# Patient Record
Sex: Female | Born: 1935 | Race: White | Hispanic: No | Marital: Single | Smoking: Never smoker
Health system: Southern US, Community
[De-identification: ages and names within clinical notes are randomized; demographics above are authoritative.]

## PROBLEM LIST (undated history)

## (undated) HISTORY — PX: TOTAL KNEE ARTHROPLASTY: SHX125

## (undated) HISTORY — PX: ABDOMINAL HYSTERECTOMY: SHX81

---

## 1997-06-09 ENCOUNTER — Encounter: Admission: RE | Admit: 1997-06-09 | Discharge: 1997-09-07 | Payer: Self-pay | Admitting: Orthopedic Surgery

## 1997-10-25 ENCOUNTER — Emergency Department (HOSPITAL_COMMUNITY): Admission: EM | Admit: 1997-10-25 | Discharge: 1997-10-25 | Payer: Self-pay | Admitting: Emergency Medicine

## 2002-03-19 ENCOUNTER — Encounter: Payer: Self-pay | Admitting: Orthopedic Surgery

## 2002-03-25 ENCOUNTER — Inpatient Hospital Stay (HOSPITAL_COMMUNITY): Admission: RE | Admit: 2002-03-25 | Discharge: 2002-03-30 | Payer: Self-pay | Admitting: Orthopedic Surgery

## 2008-05-06 ENCOUNTER — Encounter: Admission: RE | Admit: 2008-05-06 | Discharge: 2008-05-22 | Payer: Self-pay | Admitting: Orthopedic Surgery

## 2010-07-23 NOTE — Op Note (Signed)
[name], Krista Paul                          ACCOUNT NO.:  [id number]   MEDICAL RECORD NO.:  [id number]                   PATIENT TYPE:   LOCATION:                                       FACILITY:   PHYSICIAN:  Elana Alm. Thurston Hole, M.D.              DATE OF BIRTH:  11-15-1921   DATE OF PROCEDURE:  03/25/2002  DATE OF DISCHARGE:                                 OPERATIVE REPORT   PREOPERATIVE DIAGNOSIS:  Left knee degenerative joint disease.   POSTOPERATIVE DIAGNOSIS:  Left knee degenerative joint disease.   PROCEDURE:  1. Left total knee replacement using [employer] Scorpio total knee system     with #9 cemented femoral component, #9 cemented tibial component with a     15 mm polyethylene tibial spacer, and a #7 cemented resurfacing patella.  2. Left knee lateral retinacular release.   SURGEON:  Elana Alm. Thurston Hole, M.D.   ASSISTANT:  Julien Girt, P.A.   ANESTHESIA:  General.   OPERATIVE TIME:  One hour and 20 minutes.   COMPLICATIONS:  None.   DESCRIPTION OF PROCEDURE:  The patient was brought to the operating room on  March 25, 2002, and placed on the operating room table in the supine  position.  After an adequate level of general anesthesia was obtained, her  left knee was examined under anesthesia.  Range of motion was 0-130 degrees,  moderate valgus deformity.  The knee stable to ligamentous examination, with  normal patella tracking.  She had a Foley catheter placed under sterile  conditions, and received Ancef 1 g IV preoperatively for prophylaxis.  The  left leg was prepped using sterile DuraPrep, and draped using a sterile  technique.  The leg was exsanguinated and a thigh tourniquet elevated to 350  mmHg.  Initially through a 20 cm longitudinal incision based over the  patella, initial exposure was made.  The underlying subcutaneous tissues  were incised along with the skin incision.  A median arthrotomy was  performed revealing an excessive amount of  normal-appearing joint fluid.  The articular surfaces were inspected.  She had grade 4 changes throughout  the knee.  The medial lateral meniscal remnants were removed, as well as the  anterior cruciate ligament, and osteophytes removed off the femoral condyle  to the patella and the tibial plateau.  Intramedullary drill drilled up the  femoral canal for the placement of the distal femoral cutting jig, which was  placed in the appropriate amount of rotation, and the distal 12 mm cut was  made.  The distal femur was sized.  A #9 was found to be the appropriate  size, and a #9 cutting jig was placed, and these cuts were made.  The  proximal tibia was exposed.  The tibial spines were removed with an  oscillating saw.  An intramedullary drill drilled down the tibial canal for  placement of the proximal tibial cutting jig  which was placed in the  appropriate amount of rotation, and a 4 mm cut was made based off the  collateral or lower side.  After this was done, then the Scorpio PCL cutter  was placed back on the distal femur, and these cuts were made.  At this  point then the #9 femoral trial was placed and a #9 tibial base plate trial  was placed, and with the 15 mm polyethylene spacer, there was found to be  excellent restoration of satisfactory alignment, with only 5-7 degrees of  valgus, anatomic in position, and excellent stability through a full range  of motion.  The tibial base plate was then marked for rotation, and the keel  cut was made.  After this was done, then the patella was sized.  A #7  resurfacing was found to be the appropriate size, and a 10 mm resurfacing  cut was made, and three locking holes placed.  After this was done, it was  felt that all the trial components were of excellent size, fit and  stability.  They were removed.  The knee was then jet lavaged and irrigated  with 3 L of saline solution.  The proximal tibia was then exposed, and the  #9 tibial base plate  with cement backing was hammered into position, with an  excellent fit, with excess cement being removed from around the edges.  A #9  femoral component with cement backing was then hammered into position, also  with an excellent fit, with excess cement being removed from around the  edges.  The 15 mm polyethylene spacer was placed.  Range of motion was  tested 0-120 degrees, with excellent stability.  The #7 resurfacing patella  was placed and locked into position, with excess cement being removed from  the edges.  After the cement hardened, the patella and femoral tracking was  evaluated.  There was still some moderate lateral tracking due to her valgus  deformity, and a lateral retinacular release was carried out, thus improving  the patellar tracking to normal.  After this was done, it was felt that all  the components were of excellent size, fit and stability.  The knee was  further irrigated with antibiotic solution, and then the arthrotomy was  closed with #1 Ethibond suture over two medium Hemovac drains.  The  subcutaneous tissues were closed with #0 and #2-0 Vicryl.  The skin was  closed with skin staples.  Sterile dressings were applied.  The Hemovac was  injected with 0.25% Marcaine with epinephrine and clamped.  The tourniquet  was released after sterile dressings were placed.  The patient had a femoral  nerve block placed following the procedure, for postoperative pain control.  She was then awakened and taken to the recovery room in stable condition.  The needle and sponge counts were correct x2 at the end of the case.                                                Robert A. Thurston Hole, M.D.    RAW/MEDQ  D:  03/25/2002  T:  03/25/2002  Job:  [id number]

## 2010-07-23 NOTE — Discharge Summary (Signed)
NAME:  Krista Paul, Krista Paul                        ACCOUNT NO.:  [id number]   MEDICAL RECORD NO.:  [id number]                   PATIENT TYPE:  INP   LOCATION:  5025                                 FACILITY:  [facility]   PHYSICIAN:  Elana Alm. Thurston Hole, M.D.              DATE OF BIRTH:  Sep 01, 1921   DATE OF ADMISSION:  03/25/2002  DATE OF DISCHARGE:  03/30/2002                                 DISCHARGE SUMMARY   ADMISSION DIAGNOSES:  1. End-stage degenerative joint disease, right knee.  2. Hypertension.  3. High cholesterol.   DISCHARGE DIAGNOSES:  1. End-stage degenerative joint disease.  2. Hypertension.  3. High cholesterol.  4. Hypokalemia.   HISTORY OF PRESENT ILLNESS:  The patient is a 77 year old white female with  a history of end-stage DJD of her right knee.  She has failed conservative  treatment including anti-inflammatories and cortisone injections.  At this  point in time, she had pain at night and pain with rest unrelieved by any  medications.  She understands the risks, benefits, and possible  complications of a total knee replacement and is without question.   PROCEDURE IN HOUSE:  On 03/25/02, she underwent a right total knee  replacement by Dr. Thurston Hole.  She tolerated the procedure well.  She was  admitted postoperatively for DVT prophylaxis, pain control, and physical  therapy.   HOSPITAL COURSE:  On postoperative day #1, hemoglobin was 9.1.  The INR was  1.6.  Her potassium was 6.  Her potassium was discontinued out of her IV  fluids.  She was changed to normal saline.  Her sodium was 132.  On  postoperative day #2, sodium continued to drop and now was 129 despite being  on normal saline fluids.  Medicine was consulted for this.  Her hemoglobin  was 7.2.  She was transfused two units of packed red blood cells.  On  postoperative day #3, the patient is doing significantly better.  Her pain  is under control with Darvocet-N 100.  Her hemoglobin is 10.5 post-  transfusion.  Her sodium is now 142.  Her INR is 1.5.  On postoperative day  #4, the patient is doing well.  Her pain is controlled by Darvocet.  Her T-  max is 100.2 degrees Fahrenheit.  Temperature evaluation was 97.3 degrees  Fahrenheit.  Her blood pressure was under control.  Her potassium was 3.8.  Her sodium was 139.  Her INR was 1.5.  Her hemoglobin was 10.  She was  discharged to home in stable condition.   DISCHARGE MEDICATIONS:  1. Darvocet-N 100 1-2 q.4-6 h. p.r.n. pain.  2. Coumadin 1 mg three tablets daily.  3. Colace 100 mg 2 p.o. daily.  4. Senokot two tablets p.o. q.h.s.  5. Iron one tablet twice a day.    FOLLOW UP:  She will follow up in our office in 10 days for suture removal  and x-rays.  She is weightbearing as tolerated on a regular diet.  She has  been instructed to call with increased temperature, increased redness,  increased drainage.     Kirstin Shepperson, P.A.                  Robert A. Thurston Hole, M.D.    KS/MEDQ  D:  04/14/2002  T:  04/14/2002  Job:  [id number]

## 2011-09-30 DIAGNOSIS — L570 Actinic keratosis: Secondary | ICD-10-CM | POA: Diagnosis not present

## 2011-12-09 DIAGNOSIS — Z23 Encounter for immunization: Secondary | ICD-10-CM | POA: Diagnosis not present

## 2012-04-09 DIAGNOSIS — Z13820 Encounter for screening for osteoporosis: Secondary | ICD-10-CM | POA: Diagnosis not present

## 2012-04-09 DIAGNOSIS — C439 Malignant melanoma of skin, unspecified: Secondary | ICD-10-CM | POA: Diagnosis not present

## 2012-04-09 DIAGNOSIS — E7800 Pure hypercholesterolemia, unspecified: Secondary | ICD-10-CM | POA: Diagnosis not present

## 2012-04-09 DIAGNOSIS — Z23 Encounter for immunization: Secondary | ICD-10-CM | POA: Diagnosis not present

## 2012-04-09 DIAGNOSIS — I10 Essential (primary) hypertension: Secondary | ICD-10-CM | POA: Diagnosis not present

## 2012-04-09 DIAGNOSIS — Z0000 Encounter for general adult medical examination without abnormal findings: Secondary | ICD-10-CM | POA: Diagnosis not present

## 2012-04-23 DIAGNOSIS — D649 Anemia, unspecified: Secondary | ICD-10-CM | POA: Diagnosis not present

## 2012-04-25 DIAGNOSIS — Z1231 Encounter for screening mammogram for malignant neoplasm of breast: Secondary | ICD-10-CM | POA: Diagnosis not present

## 2012-04-25 DIAGNOSIS — M810 Age-related osteoporosis without current pathological fracture: Secondary | ICD-10-CM | POA: Diagnosis not present

## 2012-04-27 DIAGNOSIS — D649 Anemia, unspecified: Secondary | ICD-10-CM | POA: Diagnosis not present

## 2012-05-24 DIAGNOSIS — M810 Age-related osteoporosis without current pathological fracture: Secondary | ICD-10-CM | POA: Diagnosis not present

## 2012-10-24 DIAGNOSIS — R609 Edema, unspecified: Secondary | ICD-10-CM | POA: Diagnosis not present

## 2012-10-24 DIAGNOSIS — L089 Local infection of the skin and subcutaneous tissue, unspecified: Secondary | ICD-10-CM | POA: Diagnosis not present

## 2012-11-29 DIAGNOSIS — Z23 Encounter for immunization: Secondary | ICD-10-CM | POA: Diagnosis not present

## 2012-12-18 DIAGNOSIS — L821 Other seborrheic keratosis: Secondary | ICD-10-CM | POA: Diagnosis not present

## 2012-12-18 DIAGNOSIS — D239 Other benign neoplasm of skin, unspecified: Secondary | ICD-10-CM | POA: Diagnosis not present

## 2013-04-11 DIAGNOSIS — M810 Age-related osteoporosis without current pathological fracture: Secondary | ICD-10-CM | POA: Diagnosis not present

## 2013-04-11 DIAGNOSIS — Z0000 Encounter for general adult medical examination without abnormal findings: Secondary | ICD-10-CM | POA: Diagnosis not present

## 2013-04-11 DIAGNOSIS — R82998 Other abnormal findings in urine: Secondary | ICD-10-CM | POA: Diagnosis not present

## 2013-04-11 DIAGNOSIS — E7800 Pure hypercholesterolemia, unspecified: Secondary | ICD-10-CM | POA: Diagnosis not present

## 2013-04-11 DIAGNOSIS — C439 Malignant melanoma of skin, unspecified: Secondary | ICD-10-CM | POA: Diagnosis not present

## 2013-04-11 DIAGNOSIS — I10 Essential (primary) hypertension: Secondary | ICD-10-CM | POA: Diagnosis not present

## 2013-04-11 DIAGNOSIS — E559 Vitamin D deficiency, unspecified: Secondary | ICD-10-CM | POA: Diagnosis not present

## 2013-04-11 DIAGNOSIS — Z23 Encounter for immunization: Secondary | ICD-10-CM | POA: Diagnosis not present

## 2013-07-03 DIAGNOSIS — M79609 Pain in unspecified limb: Secondary | ICD-10-CM | POA: Diagnosis not present

## 2013-07-03 DIAGNOSIS — M2040 Other hammer toe(s) (acquired), unspecified foot: Secondary | ICD-10-CM | POA: Diagnosis not present

## 2013-07-03 DIAGNOSIS — D2370 Other benign neoplasm of skin of unspecified lower limb, including hip: Secondary | ICD-10-CM | POA: Diagnosis not present

## 2013-11-26 DIAGNOSIS — Z23 Encounter for immunization: Secondary | ICD-10-CM | POA: Diagnosis not present

## 2013-12-31 DIAGNOSIS — H25013 Cortical age-related cataract, bilateral: Secondary | ICD-10-CM | POA: Diagnosis not present

## 2014-01-21 DIAGNOSIS — D485 Neoplasm of uncertain behavior of skin: Secondary | ICD-10-CM | POA: Diagnosis not present

## 2014-01-21 DIAGNOSIS — L570 Actinic keratosis: Secondary | ICD-10-CM | POA: Diagnosis not present

## 2014-03-11 DIAGNOSIS — M9903 Segmental and somatic dysfunction of lumbar region: Secondary | ICD-10-CM | POA: Diagnosis not present

## 2014-03-11 DIAGNOSIS — M9902 Segmental and somatic dysfunction of thoracic region: Secondary | ICD-10-CM | POA: Diagnosis not present

## 2014-03-11 DIAGNOSIS — M5125 Other intervertebral disc displacement, thoracolumbar region: Secondary | ICD-10-CM | POA: Diagnosis not present

## 2014-03-11 DIAGNOSIS — M9901 Segmental and somatic dysfunction of cervical region: Secondary | ICD-10-CM | POA: Diagnosis not present

## 2014-03-11 DIAGNOSIS — M5020 Other cervical disc displacement, unspecified cervical region: Secondary | ICD-10-CM | POA: Diagnosis not present

## 2014-04-03 DIAGNOSIS — Z1231 Encounter for screening mammogram for malignant neoplasm of breast: Secondary | ICD-10-CM | POA: Diagnosis not present

## 2014-04-09 DIAGNOSIS — R5381 Other malaise: Secondary | ICD-10-CM | POA: Diagnosis not present

## 2014-04-09 DIAGNOSIS — M79602 Pain in left arm: Secondary | ICD-10-CM | POA: Diagnosis not present

## 2014-04-17 DIAGNOSIS — L603 Nail dystrophy: Secondary | ICD-10-CM | POA: Diagnosis not present

## 2014-04-17 DIAGNOSIS — L84 Corns and callosities: Secondary | ICD-10-CM | POA: Diagnosis not present

## 2014-04-17 DIAGNOSIS — I739 Peripheral vascular disease, unspecified: Secondary | ICD-10-CM | POA: Diagnosis not present

## 2014-05-01 DIAGNOSIS — Z0000 Encounter for general adult medical examination without abnormal findings: Secondary | ICD-10-CM | POA: Diagnosis not present

## 2014-05-01 DIAGNOSIS — N189 Chronic kidney disease, unspecified: Secondary | ICD-10-CM | POA: Diagnosis not present

## 2014-05-01 DIAGNOSIS — R899 Unspecified abnormal finding in specimens from other organs, systems and tissues: Secondary | ICD-10-CM | POA: Diagnosis not present

## 2014-05-01 DIAGNOSIS — M810 Age-related osteoporosis without current pathological fracture: Secondary | ICD-10-CM | POA: Diagnosis not present

## 2014-05-01 DIAGNOSIS — R8290 Unspecified abnormal findings in urine: Secondary | ICD-10-CM | POA: Diagnosis not present

## 2014-05-01 DIAGNOSIS — I10 Essential (primary) hypertension: Secondary | ICD-10-CM | POA: Diagnosis not present

## 2014-05-01 DIAGNOSIS — C439 Malignant melanoma of skin, unspecified: Secondary | ICD-10-CM | POA: Diagnosis not present

## 2014-05-01 DIAGNOSIS — E780 Pure hypercholesterolemia: Secondary | ICD-10-CM | POA: Diagnosis not present

## 2014-09-15 ENCOUNTER — Encounter (HOSPITAL_COMMUNITY): Payer: Self-pay

## 2014-09-15 ENCOUNTER — Emergency Department (HOSPITAL_COMMUNITY): Payer: Medicare Other

## 2014-09-15 ENCOUNTER — Emergency Department (HOSPITAL_COMMUNITY)
Admission: EM | Admit: 2014-09-15 | Discharge: 2014-09-15 | Disposition: A | Discharge status: Home / Self Care | Payer: Medicare Other | Attending: Emergency Medicine | Admitting: Emergency Medicine

## 2014-09-15 DIAGNOSIS — Y9389 Activity, other specified: Secondary | ICD-10-CM | POA: Insufficient documentation

## 2014-09-15 DIAGNOSIS — S0003XA Contusion of scalp, initial encounter: Secondary | ICD-10-CM | POA: Diagnosis not present

## 2014-09-15 DIAGNOSIS — W01198A Fall on same level from slipping, tripping and stumbling with subsequent striking against other object, initial encounter: Secondary | ICD-10-CM | POA: Insufficient documentation

## 2014-09-15 DIAGNOSIS — S0990XA Unspecified injury of head, initial encounter: Secondary | ICD-10-CM | POA: Diagnosis not present

## 2014-09-15 DIAGNOSIS — Z043 Encounter for examination and observation following other accident: Secondary | ICD-10-CM | POA: Diagnosis present

## 2014-09-15 DIAGNOSIS — Y998 Other external cause status: Secondary | ICD-10-CM | POA: Diagnosis not present

## 2014-09-15 DIAGNOSIS — Y9289 Other specified places as the place of occurrence of the external cause: Secondary | ICD-10-CM | POA: Insufficient documentation

## 2014-09-15 DIAGNOSIS — W19XXXA Unspecified fall, initial encounter: Secondary | ICD-10-CM

## 2014-09-15 LAB — CBC WITH DIFFERENTIAL/PLATELET
Basophils Absolute: 0 10*3/uL (ref 0.0–0.1)
Basophils Relative: 1 % (ref 0–1)
Eosinophils Absolute: 0.1 10*3/uL (ref 0.0–0.7)
Eosinophils Relative: 3 % (ref 0–5)
HCT: 36.1 % (ref 36.0–46.0)
Hemoglobin: 11.8 g/dL — ABNORMAL LOW (ref 12.0–15.0)
Lymphocytes Relative: 14 % (ref 12–46)
Lymphs Abs: 0.6 10*3/uL — ABNORMAL LOW (ref 0.7–4.0)
MCH: 28.9 pg (ref 26.0–34.0)
MCHC: 32.7 g/dL (ref 30.0–36.0)
MCV: 88.3 fL (ref 78.0–100.0)
Monocytes Absolute: 0.4 10*3/uL (ref 0.1–1.0)
Monocytes Relative: 8 % (ref 3–12)
Neutro Abs: 3.3 10*3/uL (ref 1.7–7.7)
Neutrophils Relative %: 74 % (ref 43–77)
Platelets: 213 10*3/uL (ref 150–400)
RBC: 4.09 MIL/uL (ref 3.87–5.11)
RDW: 13.5 % (ref 11.5–15.5)
WBC: 4.5 10*3/uL (ref 4.0–10.5)

## 2014-09-15 LAB — BASIC METABOLIC PANEL
Anion gap: 11 (ref 5–15)
BUN: 28 mg/dL — ABNORMAL HIGH (ref 6–20)
CO2: 23 mmol/L (ref 22–32)
Calcium: 10.3 mg/dL (ref 8.9–10.3)
Chloride: 106 mmol/L (ref 101–111)
Creatinine, Ser: 1.32 mg/dL — ABNORMAL HIGH (ref 0.44–1.00)
GFR calc Af Amer: 40 mL/min — ABNORMAL LOW (ref >60)
GFR calc non Af Amer: 34 mL/min — ABNORMAL LOW (ref >60)
Glucose, Bld: 98 mg/dL (ref 65–99)
Potassium: 4.8 mmol/L (ref 3.5–5.1)
Sodium: 140 mmol/L (ref 135–145)

## 2014-09-15 NOTE — Discharge Instructions (Signed)
°  Facial or Scalp Contusion A facial or scalp contusion is a deep bruise on the face or head. Injuries to the face and head generally cause a lot of swelling, especially around the eyes. Contusions are the result of an injury that caused bleeding under the skin. The contusion may turn blue, purple, or yellow. Minor injuries will give you a painless contusion, but more severe contusions may stay painful and swollen for a few weeks.  CAUSES  A facial or scalp contusion is caused by a blunt injury or trauma to the face or head area.  SIGNS AND SYMPTOMS   Swelling of the injured area.   Discoloration of the injured area.   Tenderness, soreness, or pain in the injured area.  DIAGNOSIS  The diagnosis can be made by taking a medical history and doing a physical exam. An X-ray exam, CT scan, or MRI may be needed to determine if there are any associated injuries, such as broken bones (fractures). TREATMENT  Often, the best treatment for a facial or scalp contusion is applying cold compresses to the injured area. Over-the-counter medicines may also be recommended for pain control.  HOME CARE INSTRUCTIONS   Only take over-the-counter or prescription medicines as directed by your health care provider.   Apply ice to the injured area.   Put ice in a plastic bag.   Place a towel between your skin and the bag.   Leave the ice on for 20 minutes, 2-3 times a day.  SEEK MEDICAL CARE IF:  You have bite problems.   You have pain with chewing.   You are concerned about facial defects. SEEK IMMEDIATE MEDICAL CARE IF:  You have severe pain or a headache that is not relieved by medicine.   You have unusual sleepiness, confusion, or personality changes.   You throw up (vomit).   You have a persistent nosebleed.   You have double vision or blurred vision.   You have fluid drainage from your nose or ear.   You have difficulty walking or using your arms or legs.  MAKE SURE YOU:     Understand these instructions.  Will watch your condition.  Will get help right away if you are not doing well or get worse. Document Released: 03/31/2004 Document Revised: 12/12/2012 Document Reviewed: 10/04/2012 [facility] Patient Information 2015 [place], [place]. This information is not intended to replace advice given to you by your health care provider. Make sure you discuss any questions you have with your health care provider.

## 2014-09-15 NOTE — ED Provider Notes (Signed)
CSN: [id number]     Arrival date & time 09/15/14  0808 History   First MD Initiated Contact with Patient 09/15/14 [phone]     Chief Complaint  Patient presents with  . Fall     (Consider location/radiation/quality/duration/timing/severity/associated sxs/prior Treatment) HPI   89yF who woke up this morning feeling "blah." "I never feel this way." Only thing patient can attribute this two is fall she had two days ago. "My feet got tangled up." Did hit head. No LOC. Denies any pain where. No n/v. No respiratory complaints. No fever or chills. No urinary complaints.  No blood thinners. Baseline mental status per daughter.   History reviewed. No pertinent past medical history. History reviewed. No pertinent past surgical history. No family history on file. History  Substance Use Topics  . Smoking status: Never Smoker   . Smokeless tobacco: Not on file  . Alcohol Use: No   OB History    No data available     Review of Systems  All systems reviewed and negative, other than as noted in HPI.   Allergies  Review of patient's allergies indicates no known allergies.  Home Medications   Prior to Admission medications   Not on File   BP 149/81 mmHg  Pulse 80  Temp(Src) 98.1 F (36.7 C) (Oral)  Resp 16  SpO2 99% Physical Exam  Constitutional: She is oriented to person, place, and time. She appears well-developed and well-nourished. No distress.  HENT:  Head: Normocephalic and atraumatic.  Faint ecchymosis and mild swelling r forehead and peri orbitally.   Eyes: Conjunctivae are normal. Right eye exhibits no discharge. Left eye exhibits no discharge.  Neck: Neck supple.  Cardiovascular: Normal rate, regular rhythm and normal heart sounds.  Exam reveals no gallop and no friction rub.   No murmur heard. Pulmonary/Chest: Effort normal and breath sounds normal. No respiratory distress.  Abdominal: Soft. She exhibits no distension. There is no tenderness.  Musculoskeletal: She  exhibits no edema or tenderness.  No midline spinal tenderness. No bony tenderness of extremities or apparent pain with ROM of large joints.   Neurological: She is alert and oriented to person, place, and time. No cranial nerve deficit. She exhibits normal muscle tone. Coordination normal.  Steady gait  Skin: Skin is warm and dry.  Psychiatric: She has a normal mood and affect. Her behavior is normal. Thought content normal.  Nursing note and vitals reviewed.   ED Course  Procedures (including critical care time) Labs Review Labs Reviewed  CBC WITH DIFFERENTIAL/PLATELET - Abnormal; Notable for the following:    Hemoglobin 11.8 (*)    Lymphs Abs 0.6 (*)    All other components within normal limits  BASIC METABOLIC PANEL - Abnormal; Notable for the following:    BUN 28 (*)    Creatinine, Ser 1.32 (*)    GFR calc non Af Amer 34 (*)    GFR calc Af Amer 40 (*)    All other components within normal limits    Imaging Review Ct Head Wo Contrast  09/15/2014   CLINICAL DATA:  [place] 2 days ago.  Bruising around eyes.  EXAM: CT HEAD WITHOUT CONTRAST  TECHNIQUE: Contiguous axial images were obtained from the base of the skull through the vertex without contrast.  COMPARISON:  None  FINDINGS: There is age-appropriate atrophy. Low-density throughout the periventricular white matter likely represents chronic changes. No evidence for acute hemorrhage, mass lesion, midline shift, hydrocephalus or large infarct.  There is a right frontal  scalp hematoma. Globes are grossly normal. Probable calcifications in the left basal ganglia region. Paranasal sinuses are clear. Calvarium is intact. No acute bone abnormality.  IMPRESSION: No acute intracranial abnormality.  Right scalp hematoma without underlying fracture.  White matter changes suggest chronic small vessel ischemic disease.   Electronically Signed   By: Markus Daft M.D.   On: 09/15/2014 09:54     EKG Interpretation   Date/Time:  Monday September 15 2014  10:02:17 EDT Ventricular Rate:  75 PR Interval:  195 QRS Duration: 133 QT Interval:  427 QTC Calculation: 477 R Axis:   -75 Text Interpretation:  Sinus rhythm RBBB and LAFB Left ventricular  hypertrophy Confirmed by Wilson Singer  MD, Bela Nyborg ([id number]) on 09/15/2014 10:13:35  AM      MDM   Final diagnoses:  Fall, initial encounter  Scalp contusion, initial encounter    89yF who woke up this morning feeling "blah" but currently has no complaints. Fall two days ago. CT head as above. No specific urinary complaints. Nonfocal neuro exam. Labs fairly unremarkable. Denies pain.     Virgel Manifold, MD 09/15/14 [phone]

## 2014-09-15 NOTE — ED Notes (Signed)
Patient transported to CT 

## 2014-09-15 NOTE — ED Notes (Signed)
Pt presents with c/o fall that occurred on Saturday. Pt reports she fell onto a wooden fence and then the ground. Pt reports she did hit her head but denies any LOC. Pt has some purple bruising around her eyes. Pt denies any pain.

## 2014-11-04 DIAGNOSIS — M9901 Segmental and somatic dysfunction of cervical region: Secondary | ICD-10-CM | POA: Diagnosis not present

## 2014-11-04 DIAGNOSIS — M9903 Segmental and somatic dysfunction of lumbar region: Secondary | ICD-10-CM | POA: Diagnosis not present

## 2014-11-04 DIAGNOSIS — M5020 Other cervical disc displacement, unspecified cervical region: Secondary | ICD-10-CM | POA: Diagnosis not present

## 2014-11-04 DIAGNOSIS — M5125 Other intervertebral disc displacement, thoracolumbar region: Secondary | ICD-10-CM | POA: Diagnosis not present

## 2014-11-04 DIAGNOSIS — M9902 Segmental and somatic dysfunction of thoracic region: Secondary | ICD-10-CM | POA: Diagnosis not present

## 2014-11-18 DIAGNOSIS — M9903 Segmental and somatic dysfunction of lumbar region: Secondary | ICD-10-CM | POA: Diagnosis not present

## 2014-11-18 DIAGNOSIS — M5125 Other intervertebral disc displacement, thoracolumbar region: Secondary | ICD-10-CM | POA: Diagnosis not present

## 2014-11-18 DIAGNOSIS — M5020 Other cervical disc displacement, unspecified cervical region: Secondary | ICD-10-CM | POA: Diagnosis not present

## 2014-11-18 DIAGNOSIS — M9901 Segmental and somatic dysfunction of cervical region: Secondary | ICD-10-CM | POA: Diagnosis not present

## 2014-11-18 DIAGNOSIS — M9902 Segmental and somatic dysfunction of thoracic region: Secondary | ICD-10-CM | POA: Diagnosis not present

## 2014-12-29 DIAGNOSIS — Z23 Encounter for immunization: Secondary | ICD-10-CM | POA: Diagnosis not present

## 2015-01-08 DIAGNOSIS — H2513 Age-related nuclear cataract, bilateral: Secondary | ICD-10-CM | POA: Diagnosis not present

## 2015-01-13 DIAGNOSIS — M5020 Other cervical disc displacement, unspecified cervical region: Secondary | ICD-10-CM | POA: Diagnosis not present

## 2015-01-13 DIAGNOSIS — M9903 Segmental and somatic dysfunction of lumbar region: Secondary | ICD-10-CM | POA: Diagnosis not present

## 2015-01-13 DIAGNOSIS — M5125 Other intervertebral disc displacement, thoracolumbar region: Secondary | ICD-10-CM | POA: Diagnosis not present

## 2015-01-13 DIAGNOSIS — M9902 Segmental and somatic dysfunction of thoracic region: Secondary | ICD-10-CM | POA: Diagnosis not present

## 2015-01-13 DIAGNOSIS — M9901 Segmental and somatic dysfunction of cervical region: Secondary | ICD-10-CM | POA: Diagnosis not present

## 2015-03-24 DIAGNOSIS — M9903 Segmental and somatic dysfunction of lumbar region: Secondary | ICD-10-CM | POA: Diagnosis not present

## 2015-03-24 DIAGNOSIS — M5125 Other intervertebral disc displacement, thoracolumbar region: Secondary | ICD-10-CM | POA: Diagnosis not present

## 2015-03-24 DIAGNOSIS — M5020 Other cervical disc displacement, unspecified cervical region: Secondary | ICD-10-CM | POA: Diagnosis not present

## 2015-03-24 DIAGNOSIS — M9902 Segmental and somatic dysfunction of thoracic region: Secondary | ICD-10-CM | POA: Diagnosis not present

## 2015-03-24 DIAGNOSIS — M9901 Segmental and somatic dysfunction of cervical region: Secondary | ICD-10-CM | POA: Diagnosis not present

## 2015-03-27 DIAGNOSIS — L603 Nail dystrophy: Secondary | ICD-10-CM | POA: Diagnosis not present

## 2015-03-27 DIAGNOSIS — I739 Peripheral vascular disease, unspecified: Secondary | ICD-10-CM | POA: Diagnosis not present

## 2015-04-10 DIAGNOSIS — D485 Neoplasm of uncertain behavior of skin: Secondary | ICD-10-CM | POA: Diagnosis not present

## 2015-04-10 DIAGNOSIS — L814 Other melanin hyperpigmentation: Secondary | ICD-10-CM | POA: Diagnosis not present

## 2015-04-20 DIAGNOSIS — D2372 Other benign neoplasm of skin of left lower limb, including hip: Secondary | ICD-10-CM | POA: Diagnosis not present

## 2015-04-21 DIAGNOSIS — M5020 Other cervical disc displacement, unspecified cervical region: Secondary | ICD-10-CM | POA: Diagnosis not present

## 2015-04-21 DIAGNOSIS — M9901 Segmental and somatic dysfunction of cervical region: Secondary | ICD-10-CM | POA: Diagnosis not present

## 2015-04-21 DIAGNOSIS — M9903 Segmental and somatic dysfunction of lumbar region: Secondary | ICD-10-CM | POA: Diagnosis not present

## 2015-04-21 DIAGNOSIS — M5125 Other intervertebral disc displacement, thoracolumbar region: Secondary | ICD-10-CM | POA: Diagnosis not present

## 2015-04-21 DIAGNOSIS — M9902 Segmental and somatic dysfunction of thoracic region: Secondary | ICD-10-CM | POA: Diagnosis not present

## 2015-05-19 DIAGNOSIS — M5125 Other intervertebral disc displacement, thoracolumbar region: Secondary | ICD-10-CM | POA: Diagnosis not present

## 2015-05-19 DIAGNOSIS — M5020 Other cervical disc displacement, unspecified cervical region: Secondary | ICD-10-CM | POA: Diagnosis not present

## 2015-05-19 DIAGNOSIS — M9903 Segmental and somatic dysfunction of lumbar region: Secondary | ICD-10-CM | POA: Diagnosis not present

## 2015-05-19 DIAGNOSIS — M9902 Segmental and somatic dysfunction of thoracic region: Secondary | ICD-10-CM | POA: Diagnosis not present

## 2015-05-19 DIAGNOSIS — M9901 Segmental and somatic dysfunction of cervical region: Secondary | ICD-10-CM | POA: Diagnosis not present

## 2015-05-21 DIAGNOSIS — R8290 Unspecified abnormal findings in urine: Secondary | ICD-10-CM | POA: Diagnosis not present

## 2015-05-21 DIAGNOSIS — M810 Age-related osteoporosis without current pathological fracture: Secondary | ICD-10-CM | POA: Diagnosis not present

## 2015-05-21 DIAGNOSIS — Z85820 Personal history of malignant melanoma of skin: Secondary | ICD-10-CM | POA: Diagnosis not present

## 2015-05-21 DIAGNOSIS — M899 Disorder of bone, unspecified: Secondary | ICD-10-CM | POA: Diagnosis not present

## 2015-05-21 DIAGNOSIS — I10 Essential (primary) hypertension: Secondary | ICD-10-CM | POA: Diagnosis not present

## 2015-05-21 DIAGNOSIS — Z0000 Encounter for general adult medical examination without abnormal findings: Secondary | ICD-10-CM | POA: Diagnosis not present

## 2015-05-21 DIAGNOSIS — N189 Chronic kidney disease, unspecified: Secondary | ICD-10-CM | POA: Diagnosis not present

## 2015-05-21 DIAGNOSIS — E7800 Pure hypercholesterolemia, unspecified: Secondary | ICD-10-CM | POA: Diagnosis not present

## 2015-06-02 DIAGNOSIS — M5020 Other cervical disc displacement, unspecified cervical region: Secondary | ICD-10-CM | POA: Diagnosis not present

## 2015-06-02 DIAGNOSIS — M9902 Segmental and somatic dysfunction of thoracic region: Secondary | ICD-10-CM | POA: Diagnosis not present

## 2015-06-02 DIAGNOSIS — M9903 Segmental and somatic dysfunction of lumbar region: Secondary | ICD-10-CM | POA: Diagnosis not present

## 2015-06-02 DIAGNOSIS — M9901 Segmental and somatic dysfunction of cervical region: Secondary | ICD-10-CM | POA: Diagnosis not present

## 2015-06-02 DIAGNOSIS — M5125 Other intervertebral disc displacement, thoracolumbar region: Secondary | ICD-10-CM | POA: Diagnosis not present

## 2015-06-25 DIAGNOSIS — I739 Peripheral vascular disease, unspecified: Secondary | ICD-10-CM | POA: Diagnosis not present

## 2015-06-25 DIAGNOSIS — L603 Nail dystrophy: Secondary | ICD-10-CM | POA: Diagnosis not present

## 2015-07-30 ENCOUNTER — Encounter (HOSPITAL_COMMUNITY): Payer: Self-pay

## 2015-07-30 ENCOUNTER — Emergency Department (HOSPITAL_COMMUNITY)
Admission: EM | Admit: 2015-07-30 | Discharge: 2015-07-30 | Disposition: A | Discharge status: Home / Self Care | Payer: Medicare Other | Attending: Emergency Medicine | Admitting: Emergency Medicine

## 2015-07-30 ENCOUNTER — Emergency Department (HOSPITAL_COMMUNITY): Payer: Medicare Other

## 2015-07-30 DIAGNOSIS — R51 Headache: Secondary | ICD-10-CM | POA: Insufficient documentation

## 2015-07-30 DIAGNOSIS — Z96652 Presence of left artificial knee joint: Secondary | ICD-10-CM | POA: Insufficient documentation

## 2015-07-30 DIAGNOSIS — Z7982 Long term (current) use of aspirin: Secondary | ICD-10-CM | POA: Diagnosis not present

## 2015-07-30 DIAGNOSIS — R519 Headache, unspecified: Secondary | ICD-10-CM

## 2015-07-30 DIAGNOSIS — R413 Other amnesia: Secondary | ICD-10-CM

## 2015-07-30 LAB — CBC WITH DIFFERENTIAL/PLATELET
Basophils Absolute: 0 10*3/uL (ref 0.0–0.1)
Basophils Relative: 1 %
EOS ABS: 0.1 10*3/uL (ref 0.0–0.7)
Eosinophils Relative: 1 %
HEMATOCRIT: 35.9 % — AB (ref 36.0–46.0)
HEMOGLOBIN: 11.8 g/dL — AB (ref 12.0–15.0)
Lymphocytes Relative: 17 %
Lymphs Abs: 0.9 10*3/uL (ref 0.7–4.0)
MCH: 28.2 pg (ref 26.0–34.0)
MCHC: 32.9 g/dL (ref 30.0–36.0)
MCV: 85.9 fL (ref 78.0–100.0)
MONOS PCT: 5 %
Monocytes Absolute: 0.3 10*3/uL (ref 0.1–1.0)
NEUTROS ABS: 3.9 10*3/uL (ref 1.7–7.7)
Neutrophils Relative %: 76 %
Platelets: 225 10*3/uL (ref 150–400)
RBC: 4.18 MIL/uL (ref 3.87–5.11)
RDW: 13.4 % (ref 11.5–15.5)
WBC: 5.1 10*3/uL (ref 4.0–10.5)

## 2015-07-30 LAB — URINALYSIS, ROUTINE W REFLEX MICROSCOPIC
Bilirubin Urine: NEGATIVE
GLUCOSE, UA: NEGATIVE mg/dL
Ketones, ur: NEGATIVE mg/dL
Leukocytes, UA: NEGATIVE
Nitrite: NEGATIVE
PH: 7 (ref 5.0–8.0)
Protein, ur: NEGATIVE mg/dL
SPECIFIC GRAVITY, URINE: 1.012 (ref 1.005–1.030)

## 2015-07-30 LAB — URINE MICROSCOPIC-ADD ON

## 2015-07-30 LAB — COMPREHENSIVE METABOLIC PANEL
ALT: 16 U/L (ref 14–54)
AST: 22 U/L (ref 15–41)
Albumin: 4.2 g/dL (ref 3.5–5.0)
Alkaline Phosphatase: 100 U/L (ref 38–126)
Anion gap: 9 (ref 5–15)
BUN: 27 mg/dL — ABNORMAL HIGH (ref 6–20)
CHLORIDE: 108 mmol/L (ref 101–111)
CO2: 23 mmol/L (ref 22–32)
CREATININE: 1.11 mg/dL — AB (ref 0.44–1.00)
Calcium: 10.2 mg/dL (ref 8.9–10.3)
GFR calc Af Amer: 49 mL/min — ABNORMAL LOW (ref >60)
GFR calc non Af Amer: 42 mL/min — ABNORMAL LOW (ref >60)
Glucose, Bld: 92 mg/dL (ref 65–99)
Potassium: 4.5 mmol/L (ref 3.5–5.1)
SODIUM: 140 mmol/L (ref 135–145)
Total Bilirubin: 0.7 mg/dL (ref 0.3–1.2)
Total Protein: 7.3 g/dL (ref 6.5–8.1)

## 2015-07-30 MED ORDER — DOXYCYCLINE HYCLATE 100 MG PO CAPS: 100.0000 mg | ORAL_CAPSULE | Freq: Two times a day (BID) | ORAL | Status: DC

## 2015-07-30 NOTE — ED Provider Notes (Signed)
CSN: [id number]     Arrival date & time 07/30/15  1500 History   First MD Initiated Contact with Patient 07/30/15 1535     Chief Complaint  Patient presents with  . Headache   PT WAS SENT INTO THE ED TODAY B/C HER PCP WANTS HER TO GET AN MRI.  THE PT WOKE UP THIS AM WITH A HEADACHE AND IT'S BEEN GOING ON ALL DAY.  THE PT SAID THAT SHE WAS HAVING MEMORY PROBLEMS THIS MORNING THAT LASTED ABOUT 2 HRS.  THE MEMORY PROBLEMS ARE GONE AND SHE HAS NO OTHER NEURO DEFICITS.  THE PT'S PCP TRIED TO GET AN OUTPATIENT MRI, BUT WAS UNABLE TO GET AN APPT FOR PT.  PT'S PCP ALSO WANTS TO GET A RMSF TITER AND START PT ON DOXY IF EVERYTHING NL.  (Consider location/radiation/quality/duration/timing/severity/associated sxs/prior Treatment) Patient is a 82 y.o. female presenting with headaches. The history is provided by the patient.  Headache Pain location:  Generalized Quality:  Dull Radiates to:  Does not radiate Severity currently:  4/10 Onset quality:  Gradual Timing:  Constant Progression:  Unchanged Chronicity:  New Similar to prior headaches: no     History reviewed. No pertinent past medical history. Past Surgical History  Procedure Laterality Date  . Abdominal hysterectomy    . Total knee arthroplasty Left    History reviewed. No pertinent family history. Social History  Substance Use Topics  . Smoking status: Never Smoker   . Smokeless tobacco: None  . Alcohol Use: No   OB History    No data available     Review of Systems  Neurological: Positive for headaches.  All other systems reviewed and are negative.     Allergies  Codeine  Home Medications   Prior to Admission medications   Medication Sig Start Date End Date Taking? Authorizing Provider  amLODipine (NORVASC) 5 MG tablet Take 5 mg by mouth daily. 08/12/14  Yes Historical Provider, MD  aspirin EC 81 MG tablet Take 81 mg by mouth daily.   Yes Historical Provider, MD  ibuprofen (ADVIL,MOTRIN) 200 MG tablet Take 200 mg by  mouth daily as needed for moderate pain.   Yes Historical Provider, MD  OVER THE COUNTER MEDICATION Place 1 patch onto the skin daily as needed (pain). Over the counter lidocaine patch   Yes Historical Provider, MD  pravastatin (PRAVACHOL) 10 MG tablet Take 10 mg by mouth daily. 08/20/14  Yes Historical Provider, MD  doxycycline (VIBRAMYCIN) 100 MG capsule Take 1 capsule (100 mg total) by mouth 2 (two) times daily. 07/30/15   Isla Pence, MD   BP 169/105 mmHg  Pulse 94  Temp(Src) 98.3 F (36.8 C) (Oral)  Resp 18  SpO2 95% Physical Exam  Constitutional: She is oriented to person, place, and time. She appears well-developed and well-nourished.  HENT:  Head: Normocephalic and atraumatic.  Right Ear: External ear normal.  Left Ear: External ear normal.  Nose: Nose normal.  Mouth/Throat: Oropharynx is clear and moist.  Eyes: Conjunctivae and EOM are normal. Pupils are equal, round, and reactive to light.  Neck: Normal range of motion. Neck supple.  Cardiovascular: Normal rate, regular rhythm, normal heart sounds and intact distal pulses.   Pulmonary/Chest: Effort normal and breath sounds normal.  Abdominal: Soft. Bowel sounds are normal.  Musculoskeletal: Normal range of motion.  Neurological: She is alert and oriented to person, place, and time.  Skin: Skin is warm and dry.  Psychiatric: She has a normal mood and affect. Her behavior  is normal. Judgment and thought content normal.  Nursing note and vitals reviewed.   ED Course  Procedures (including critical care time) Labs Review Labs Reviewed  URINALYSIS, ROUTINE W REFLEX MICROSCOPIC (NOT AT [facility]) - Abnormal; Notable for the following:    Hgb urine dipstick SMALL (*)    All other components within normal limits  URINE MICROSCOPIC-ADD ON - Abnormal; Notable for the following:    Squamous Epithelial / LPF 0-5 (*)    Bacteria, UA RARE (*)    All other components within normal limits  COMPREHENSIVE METABOLIC PANEL  CBC WITH  DIFFERENTIAL/PLATELET  ROCKY MTN SPOTTED FVR ABS PNL(IGG+IGM)    Imaging Review No results found. I have personally reviewed and evaluated these images and lab results as part of my medical decision-making.   EKG Interpretation None      MDM  PT SIGNED OUT TO DR. BELFI WHO WILL FOLLOW RESULTS OF MRI AND LABS.  Final diagnoses:  Acute nonintractable headache, unspecified headache type        Isla Pence, MD 07/30/15 [phone]

## 2015-07-30 NOTE — ED Notes (Signed)
Patient transported to MRI 

## 2015-07-30 NOTE — ED Notes (Signed)
Patient made aware of urine sample. States she cannot void at this time. Encouraged to void when able.

## 2015-07-30 NOTE — ED Notes (Signed)
Bed: WA06 Expected date:  Expected time:  Means of arrival:  Comments: 

## 2015-07-30 NOTE — Discharge Instructions (Signed)

## 2015-07-30 NOTE — ED Notes (Signed)
Pt's daughter reports "the doctor also wants her checked for [facility] Spotted fever."  Pt denies bites or red spots.

## 2015-07-30 NOTE — ED Provider Notes (Signed)
Results for orders placed or performed during the hospital encounter of 07/30/15  Comprehensive metabolic panel  Result Value Ref Range   Sodium 140 135 - 145 mmol/L   Potassium 4.5 3.5 - 5.1 mmol/L   Chloride 108 101 - 111 mmol/L   CO2 23 22 - 32 mmol/L   Glucose, Bld 92 65 - 99 mg/dL   BUN 27 (H) 6 - 20 mg/dL   Creatinine, Ser 1.11 (H) 0.44 - 1.00 mg/dL   Calcium 10.2 8.9 - 10.3 mg/dL   Total Protein 7.3 6.5 - 8.1 g/dL   Albumin 4.2 3.5 - 5.0 g/dL   AST 22 15 - 41 U/L   ALT 16 14 - 54 U/L   Alkaline Phosphatase 100 38 - 126 U/L   Total Bilirubin 0.7 0.3 - 1.2 mg/dL   GFR calc non Af Amer 42 (L) >60 mL/min   GFR calc Af Amer 49 (L) >60 mL/min   Anion gap 9 5 - 15  CBC with Differential  Result Value Ref Range   WBC 5.1 4.0 - 10.5 K/uL   RBC 4.18 3.87 - 5.11 MIL/uL   Hemoglobin 11.8 (L) 12.0 - 15.0 g/dL   HCT 35.9 (L) 36.0 - 46.0 %   MCV 85.9 78.0 - 100.0 fL   MCH 28.2 26.0 - 34.0 pg   MCHC 32.9 30.0 - 36.0 g/dL   RDW 13.4 11.5 - 15.5 %   Platelets 225 150 - 400 K/uL   Neutrophils Relative % 76 %   Neutro Abs 3.9 1.7 - 7.7 K/uL   Lymphocytes Relative 17 %   Lymphs Abs 0.9 0.7 - 4.0 K/uL   Monocytes Relative 5 %   Monocytes Absolute 0.3 0.1 - 1.0 K/uL   Eosinophils Relative 1 %   Eosinophils Absolute 0.1 0.0 - 0.7 K/uL   Basophils Relative 1 %   Basophils Absolute 0.0 0.0 - 0.1 K/uL  Urinalysis, Routine w reflex microscopic  Result Value Ref Range   Color, Urine YELLOW YELLOW   APPearance CLEAR CLEAR   Specific Gravity, Urine 1.012 1.005 - 1.030   pH 7.0 5.0 - 8.0   Glucose, UA NEGATIVE NEGATIVE mg/dL   Hgb urine dipstick SMALL (A) NEGATIVE   Bilirubin Urine NEGATIVE NEGATIVE   Ketones, ur NEGATIVE NEGATIVE mg/dL   Protein, ur NEGATIVE NEGATIVE mg/dL   Nitrite NEGATIVE NEGATIVE   Leukocytes, UA NEGATIVE NEGATIVE  Urine microscopic-add on  Result Value Ref Range   Squamous Epithelial / LPF 0-5 (A) NONE SEEN   WBC, UA 0-5 0 - 5 WBC/hpf   RBC / HPF 0-5 0 - 5  RBC/hpf   Bacteria, UA RARE (A) NONE SEEN   Urine-Other MUCOUS PRESENT    Mr Brain Wo Contrast  07/30/2015  CLINICAL DATA:  Memory loss.  Headache. EXAM: MRI HEAD WITHOUT CONTRAST TECHNIQUE: Multiplanar, multiecho pulse sequences of the brain and surrounding structures were obtained without intravenous contrast. COMPARISON:  Head CT 09/15/2014 FINDINGS: There is no evidence of acute infarct, intracranial hemorrhage, intra-axial mass, midline shift, or extra-axial fluid collection. Age related cerebral atrophy is noted. Periventricular white-matter T2 hyperintensities are nonspecific but compatible with mild chronic small vessel ischemic disease. Patchy T2 hyperintensity is noted in the deep gray nuclei bilaterally as well. 1 cm exostosis or calcified meningioma in the right parietal region is unchanged and likely of no clinical significance. Orbits are unremarkable. Right maxillary sinus mucous retention cyst is noted. Mastoid air cells are clear. Major intracranial vascular flow voids are  preserved. IMPRESSION: 1. No acute intracranial abnormality. 2. Mild chronic small vessel ischemic disease. Electronically Signed   By: Logan Bores M.D.   On: 07/30/2015 19:15    Pt feeling much better.  MRI neg.  Labs unremarkable.  Will d/c to f/u with PCP.  Started on doxycycline as per plan.  Malvin Johns, MD 07/30/15 2023

## 2015-07-30 NOTE — ED Notes (Signed)
Pt c/o headache and memory problems since waking up this morning.  Pain score  4/10.  Pt has not taken anything for pain.  Denies blurred vision and light sensitivity.  Denies numbness, weakness, and tingling.  Denies n/v/d.  Pt reports that memory issues have resolved.  Pt was seen by PCP earlier and referred to [facility] for a MRI.  However, imaging center had not appointments and directed Pt to the ED.

## 2015-07-30 NOTE — ED Notes (Signed)
Pt ambulated to restroom. 

## 2015-07-30 NOTE — ED Notes (Signed)
Lab delay -  Pt in MRI

## 2015-07-31 LAB — ROCKY MTN SPOTTED FVR ABS PNL(IGG+IGM)
RMSF IGG: NEGATIVE
RMSF IgM: 0.23 index (ref 0.00–0.89)

## 2015-08-12 DIAGNOSIS — H2513 Age-related nuclear cataract, bilateral: Secondary | ICD-10-CM | POA: Diagnosis not present

## 2015-08-17 DIAGNOSIS — L570 Actinic keratosis: Secondary | ICD-10-CM | POA: Diagnosis not present

## 2015-08-17 DIAGNOSIS — L723 Sebaceous cyst: Secondary | ICD-10-CM | POA: Diagnosis not present

## 2015-08-17 DIAGNOSIS — D239 Other benign neoplasm of skin, unspecified: Secondary | ICD-10-CM | POA: Diagnosis not present

## 2015-08-17 DIAGNOSIS — L821 Other seborrheic keratosis: Secondary | ICD-10-CM | POA: Diagnosis not present

## 2015-09-04 DIAGNOSIS — I739 Peripheral vascular disease, unspecified: Secondary | ICD-10-CM | POA: Diagnosis not present

## 2015-09-04 DIAGNOSIS — L84 Corns and callosities: Secondary | ICD-10-CM | POA: Diagnosis not present

## 2015-09-04 DIAGNOSIS — L603 Nail dystrophy: Secondary | ICD-10-CM | POA: Diagnosis not present

## 2015-09-15 DIAGNOSIS — M5020 Other cervical disc displacement, unspecified cervical region: Secondary | ICD-10-CM | POA: Diagnosis not present

## 2015-09-15 DIAGNOSIS — M9903 Segmental and somatic dysfunction of lumbar region: Secondary | ICD-10-CM | POA: Diagnosis not present

## 2015-09-15 DIAGNOSIS — M5125 Other intervertebral disc displacement, thoracolumbar region: Secondary | ICD-10-CM | POA: Diagnosis not present

## 2015-09-15 DIAGNOSIS — M9901 Segmental and somatic dysfunction of cervical region: Secondary | ICD-10-CM | POA: Diagnosis not present

## 2015-09-15 DIAGNOSIS — M9902 Segmental and somatic dysfunction of thoracic region: Secondary | ICD-10-CM | POA: Diagnosis not present

## 2015-09-29 DIAGNOSIS — M9901 Segmental and somatic dysfunction of cervical region: Secondary | ICD-10-CM | POA: Diagnosis not present

## 2015-09-29 DIAGNOSIS — M9903 Segmental and somatic dysfunction of lumbar region: Secondary | ICD-10-CM | POA: Diagnosis not present

## 2015-09-29 DIAGNOSIS — M9902 Segmental and somatic dysfunction of thoracic region: Secondary | ICD-10-CM | POA: Diagnosis not present

## 2015-09-29 DIAGNOSIS — M5125 Other intervertebral disc displacement, thoracolumbar region: Secondary | ICD-10-CM | POA: Diagnosis not present

## 2015-09-29 DIAGNOSIS — M5020 Other cervical disc displacement, unspecified cervical region: Secondary | ICD-10-CM | POA: Diagnosis not present

## 2015-10-13 DIAGNOSIS — M9901 Segmental and somatic dysfunction of cervical region: Secondary | ICD-10-CM | POA: Diagnosis not present

## 2015-10-13 DIAGNOSIS — M5020 Other cervical disc displacement, unspecified cervical region: Secondary | ICD-10-CM | POA: Diagnosis not present

## 2015-10-13 DIAGNOSIS — M9903 Segmental and somatic dysfunction of lumbar region: Secondary | ICD-10-CM | POA: Diagnosis not present

## 2015-10-13 DIAGNOSIS — M9902 Segmental and somatic dysfunction of thoracic region: Secondary | ICD-10-CM | POA: Diagnosis not present

## 2015-10-13 DIAGNOSIS — M5125 Other intervertebral disc displacement, thoracolumbar region: Secondary | ICD-10-CM | POA: Diagnosis not present

## 2015-10-27 DIAGNOSIS — M9903 Segmental and somatic dysfunction of lumbar region: Secondary | ICD-10-CM | POA: Diagnosis not present

## 2015-10-27 DIAGNOSIS — M9901 Segmental and somatic dysfunction of cervical region: Secondary | ICD-10-CM | POA: Diagnosis not present

## 2015-10-27 DIAGNOSIS — M5125 Other intervertebral disc displacement, thoracolumbar region: Secondary | ICD-10-CM | POA: Diagnosis not present

## 2015-10-27 DIAGNOSIS — M9902 Segmental and somatic dysfunction of thoracic region: Secondary | ICD-10-CM | POA: Diagnosis not present

## 2015-10-27 DIAGNOSIS — M5020 Other cervical disc displacement, unspecified cervical region: Secondary | ICD-10-CM | POA: Diagnosis not present

## 2015-11-24 DIAGNOSIS — M9901 Segmental and somatic dysfunction of cervical region: Secondary | ICD-10-CM | POA: Diagnosis not present

## 2015-11-24 DIAGNOSIS — M9902 Segmental and somatic dysfunction of thoracic region: Secondary | ICD-10-CM | POA: Diagnosis not present

## 2015-11-24 DIAGNOSIS — M5020 Other cervical disc displacement, unspecified cervical region: Secondary | ICD-10-CM | POA: Diagnosis not present

## 2015-11-24 DIAGNOSIS — M9903 Segmental and somatic dysfunction of lumbar region: Secondary | ICD-10-CM | POA: Diagnosis not present

## 2015-11-24 DIAGNOSIS — M5125 Other intervertebral disc displacement, thoracolumbar region: Secondary | ICD-10-CM | POA: Diagnosis not present

## 2015-12-22 DIAGNOSIS — M9903 Segmental and somatic dysfunction of lumbar region: Secondary | ICD-10-CM | POA: Diagnosis not present

## 2015-12-22 DIAGNOSIS — M9902 Segmental and somatic dysfunction of thoracic region: Secondary | ICD-10-CM | POA: Diagnosis not present

## 2015-12-22 DIAGNOSIS — M5125 Other intervertebral disc displacement, thoracolumbar region: Secondary | ICD-10-CM | POA: Diagnosis not present

## 2015-12-22 DIAGNOSIS — M5020 Other cervical disc displacement, unspecified cervical region: Secondary | ICD-10-CM | POA: Diagnosis not present

## 2015-12-22 DIAGNOSIS — M9901 Segmental and somatic dysfunction of cervical region: Secondary | ICD-10-CM | POA: Diagnosis not present

## 2015-12-24 DIAGNOSIS — Z23 Encounter for immunization: Secondary | ICD-10-CM | POA: Diagnosis not present

## 2016-01-05 DIAGNOSIS — M5125 Other intervertebral disc displacement, thoracolumbar region: Secondary | ICD-10-CM | POA: Diagnosis not present

## 2016-01-05 DIAGNOSIS — M9903 Segmental and somatic dysfunction of lumbar region: Secondary | ICD-10-CM | POA: Diagnosis not present

## 2016-01-05 DIAGNOSIS — M5020 Other cervical disc displacement, unspecified cervical region: Secondary | ICD-10-CM | POA: Diagnosis not present

## 2016-01-05 DIAGNOSIS — M9901 Segmental and somatic dysfunction of cervical region: Secondary | ICD-10-CM | POA: Diagnosis not present

## 2016-01-05 DIAGNOSIS — M9902 Segmental and somatic dysfunction of thoracic region: Secondary | ICD-10-CM | POA: Diagnosis not present

## 2016-02-16 DIAGNOSIS — M9902 Segmental and somatic dysfunction of thoracic region: Secondary | ICD-10-CM | POA: Diagnosis not present

## 2016-02-16 DIAGNOSIS — M5020 Other cervical disc displacement, unspecified cervical region: Secondary | ICD-10-CM | POA: Diagnosis not present

## 2016-02-16 DIAGNOSIS — M5125 Other intervertebral disc displacement, thoracolumbar region: Secondary | ICD-10-CM | POA: Diagnosis not present

## 2016-02-16 DIAGNOSIS — M9903 Segmental and somatic dysfunction of lumbar region: Secondary | ICD-10-CM | POA: Diagnosis not present

## 2016-02-16 DIAGNOSIS — M9901 Segmental and somatic dysfunction of cervical region: Secondary | ICD-10-CM | POA: Diagnosis not present

## 2016-03-09 DIAGNOSIS — M71572 Other bursitis, not elsewhere classified, left ankle and foot: Secondary | ICD-10-CM | POA: Diagnosis not present

## 2016-03-09 DIAGNOSIS — H2513 Age-related nuclear cataract, bilateral: Secondary | ICD-10-CM | POA: Diagnosis not present

## 2016-03-09 DIAGNOSIS — M71571 Other bursitis, not elsewhere classified, right ankle and foot: Secondary | ICD-10-CM | POA: Diagnosis not present

## 2016-03-09 DIAGNOSIS — L600 Ingrowing nail: Secondary | ICD-10-CM | POA: Diagnosis not present

## 2016-03-15 DIAGNOSIS — M9903 Segmental and somatic dysfunction of lumbar region: Secondary | ICD-10-CM | POA: Diagnosis not present

## 2016-03-15 DIAGNOSIS — M5125 Other intervertebral disc displacement, thoracolumbar region: Secondary | ICD-10-CM | POA: Diagnosis not present

## 2016-03-15 DIAGNOSIS — M9901 Segmental and somatic dysfunction of cervical region: Secondary | ICD-10-CM | POA: Diagnosis not present

## 2016-03-15 DIAGNOSIS — M5020 Other cervical disc displacement, unspecified cervical region: Secondary | ICD-10-CM | POA: Diagnosis not present

## 2016-03-15 DIAGNOSIS — M9902 Segmental and somatic dysfunction of thoracic region: Secondary | ICD-10-CM | POA: Diagnosis not present

## 2016-04-26 DIAGNOSIS — M9902 Segmental and somatic dysfunction of thoracic region: Secondary | ICD-10-CM | POA: Diagnosis not present

## 2016-04-26 DIAGNOSIS — M9903 Segmental and somatic dysfunction of lumbar region: Secondary | ICD-10-CM | POA: Diagnosis not present

## 2016-04-26 DIAGNOSIS — M5125 Other intervertebral disc displacement, thoracolumbar region: Secondary | ICD-10-CM | POA: Diagnosis not present

## 2016-04-26 DIAGNOSIS — M9901 Segmental and somatic dysfunction of cervical region: Secondary | ICD-10-CM | POA: Diagnosis not present

## 2016-04-26 DIAGNOSIS — M5020 Other cervical disc displacement, unspecified cervical region: Secondary | ICD-10-CM | POA: Diagnosis not present

## 2016-05-10 DIAGNOSIS — M9902 Segmental and somatic dysfunction of thoracic region: Secondary | ICD-10-CM | POA: Diagnosis not present

## 2016-05-10 DIAGNOSIS — M5020 Other cervical disc displacement, unspecified cervical region: Secondary | ICD-10-CM | POA: Diagnosis not present

## 2016-05-10 DIAGNOSIS — M9903 Segmental and somatic dysfunction of lumbar region: Secondary | ICD-10-CM | POA: Diagnosis not present

## 2016-05-10 DIAGNOSIS — M9901 Segmental and somatic dysfunction of cervical region: Secondary | ICD-10-CM | POA: Diagnosis not present

## 2016-05-10 DIAGNOSIS — M5125 Other intervertebral disc displacement, thoracolumbar region: Secondary | ICD-10-CM | POA: Diagnosis not present

## 2016-05-24 DIAGNOSIS — M5020 Other cervical disc displacement, unspecified cervical region: Secondary | ICD-10-CM | POA: Diagnosis not present

## 2016-05-24 DIAGNOSIS — M9902 Segmental and somatic dysfunction of thoracic region: Secondary | ICD-10-CM | POA: Diagnosis not present

## 2016-05-24 DIAGNOSIS — M5125 Other intervertebral disc displacement, thoracolumbar region: Secondary | ICD-10-CM | POA: Diagnosis not present

## 2016-05-24 DIAGNOSIS — M9901 Segmental and somatic dysfunction of cervical region: Secondary | ICD-10-CM | POA: Diagnosis not present

## 2016-05-24 DIAGNOSIS — M9903 Segmental and somatic dysfunction of lumbar region: Secondary | ICD-10-CM | POA: Diagnosis not present

## 2016-06-09 DIAGNOSIS — M899 Disorder of bone, unspecified: Secondary | ICD-10-CM | POA: Diagnosis not present

## 2016-06-09 DIAGNOSIS — Z85820 Personal history of malignant melanoma of skin: Secondary | ICD-10-CM | POA: Diagnosis not present

## 2016-06-09 DIAGNOSIS — I10 Essential (primary) hypertension: Secondary | ICD-10-CM | POA: Diagnosis not present

## 2016-06-09 DIAGNOSIS — R8290 Unspecified abnormal findings in urine: Secondary | ICD-10-CM | POA: Diagnosis not present

## 2016-06-09 DIAGNOSIS — M810 Age-related osteoporosis without current pathological fracture: Secondary | ICD-10-CM | POA: Diagnosis not present

## 2016-06-09 DIAGNOSIS — Z23 Encounter for immunization: Secondary | ICD-10-CM | POA: Diagnosis not present

## 2016-06-09 DIAGNOSIS — N189 Chronic kidney disease, unspecified: Secondary | ICD-10-CM | POA: Diagnosis not present

## 2016-06-09 DIAGNOSIS — Z0000 Encounter for general adult medical examination without abnormal findings: Secondary | ICD-10-CM | POA: Diagnosis not present

## 2016-06-21 DIAGNOSIS — M5020 Other cervical disc displacement, unspecified cervical region: Secondary | ICD-10-CM | POA: Diagnosis not present

## 2016-06-21 DIAGNOSIS — M9902 Segmental and somatic dysfunction of thoracic region: Secondary | ICD-10-CM | POA: Diagnosis not present

## 2016-06-21 DIAGNOSIS — M5125 Other intervertebral disc displacement, thoracolumbar region: Secondary | ICD-10-CM | POA: Diagnosis not present

## 2016-06-21 DIAGNOSIS — M9903 Segmental and somatic dysfunction of lumbar region: Secondary | ICD-10-CM | POA: Diagnosis not present

## 2016-06-21 DIAGNOSIS — M9901 Segmental and somatic dysfunction of cervical region: Secondary | ICD-10-CM | POA: Diagnosis not present

## 2016-07-05 DIAGNOSIS — M9901 Segmental and somatic dysfunction of cervical region: Secondary | ICD-10-CM | POA: Diagnosis not present

## 2016-07-05 DIAGNOSIS — M5020 Other cervical disc displacement, unspecified cervical region: Secondary | ICD-10-CM | POA: Diagnosis not present

## 2016-07-05 DIAGNOSIS — M5125 Other intervertebral disc displacement, thoracolumbar region: Secondary | ICD-10-CM | POA: Diagnosis not present

## 2016-07-05 DIAGNOSIS — M9902 Segmental and somatic dysfunction of thoracic region: Secondary | ICD-10-CM | POA: Diagnosis not present

## 2016-07-05 DIAGNOSIS — M9903 Segmental and somatic dysfunction of lumbar region: Secondary | ICD-10-CM | POA: Diagnosis not present

## 2016-07-07 DIAGNOSIS — S76911A Strain of unspecified muscles, fascia and tendons at thigh level, right thigh, initial encounter: Secondary | ICD-10-CM | POA: Diagnosis not present

## 2016-07-07 DIAGNOSIS — M25551 Pain in right hip: Secondary | ICD-10-CM | POA: Diagnosis not present

## 2016-07-07 DIAGNOSIS — S32591A Other specified fracture of right pubis, initial encounter for closed fracture: Secondary | ICD-10-CM | POA: Diagnosis not present

## 2016-07-07 DIAGNOSIS — S76011A Strain of muscle, fascia and tendon of right hip, initial encounter: Secondary | ICD-10-CM | POA: Diagnosis not present

## 2016-07-10 DIAGNOSIS — S3289XA Fracture of other parts of pelvis, initial encounter for closed fracture: Secondary | ICD-10-CM | POA: Diagnosis not present

## 2016-07-14 ENCOUNTER — Other Ambulatory Visit: Payer: Self-pay | Admitting: Orthopedic Surgery

## 2016-07-14 ENCOUNTER — Ambulatory Visit
Admission: RE | Admit: 2016-07-14 | Discharge: 2016-07-14 | Disposition: A | Discharge status: Home / Self Care | Payer: Medicare Other | Source: Ambulatory Visit | Attending: Orthopedic Surgery | Admitting: Orthopedic Surgery

## 2016-07-14 DIAGNOSIS — M25551 Pain in right hip: Secondary | ICD-10-CM

## 2016-07-14 DIAGNOSIS — S32591A Other specified fracture of right pubis, initial encounter for closed fracture: Secondary | ICD-10-CM | POA: Diagnosis not present

## 2016-07-18 DIAGNOSIS — S32591A Other specified fracture of right pubis, initial encounter for closed fracture: Secondary | ICD-10-CM | POA: Diagnosis not present

## 2016-07-19 DIAGNOSIS — I10 Essential (primary) hypertension: Secondary | ICD-10-CM | POA: Diagnosis not present

## 2016-07-19 DIAGNOSIS — S32501D Unspecified fracture of right pubis, subsequent encounter for fracture with routine healing: Secondary | ICD-10-CM | POA: Diagnosis not present

## 2016-07-19 DIAGNOSIS — E785 Hyperlipidemia, unspecified: Secondary | ICD-10-CM | POA: Diagnosis not present

## 2016-07-19 DIAGNOSIS — Z9181 History of falling: Secondary | ICD-10-CM | POA: Diagnosis not present

## 2016-07-19 DIAGNOSIS — M810 Age-related osteoporosis without current pathological fracture: Secondary | ICD-10-CM | POA: Diagnosis not present

## 2016-07-21 DIAGNOSIS — E785 Hyperlipidemia, unspecified: Secondary | ICD-10-CM | POA: Diagnosis not present

## 2016-07-21 DIAGNOSIS — S32501D Unspecified fracture of right pubis, subsequent encounter for fracture with routine healing: Secondary | ICD-10-CM | POA: Diagnosis not present

## 2016-07-21 DIAGNOSIS — I10 Essential (primary) hypertension: Secondary | ICD-10-CM | POA: Diagnosis not present

## 2016-07-21 DIAGNOSIS — Z9181 History of falling: Secondary | ICD-10-CM | POA: Diagnosis not present

## 2016-07-21 DIAGNOSIS — M810 Age-related osteoporosis without current pathological fracture: Secondary | ICD-10-CM | POA: Diagnosis not present

## 2016-07-26 DIAGNOSIS — I10 Essential (primary) hypertension: Secondary | ICD-10-CM | POA: Diagnosis not present

## 2016-07-26 DIAGNOSIS — E785 Hyperlipidemia, unspecified: Secondary | ICD-10-CM | POA: Diagnosis not present

## 2016-07-26 DIAGNOSIS — S32501D Unspecified fracture of right pubis, subsequent encounter for fracture with routine healing: Secondary | ICD-10-CM | POA: Diagnosis not present

## 2016-07-26 DIAGNOSIS — Z9181 History of falling: Secondary | ICD-10-CM | POA: Diagnosis not present

## 2016-07-26 DIAGNOSIS — M810 Age-related osteoporosis without current pathological fracture: Secondary | ICD-10-CM | POA: Diagnosis not present

## 2016-07-28 DIAGNOSIS — E785 Hyperlipidemia, unspecified: Secondary | ICD-10-CM | POA: Diagnosis not present

## 2016-07-28 DIAGNOSIS — Z9181 History of falling: Secondary | ICD-10-CM | POA: Diagnosis not present

## 2016-07-28 DIAGNOSIS — I10 Essential (primary) hypertension: Secondary | ICD-10-CM | POA: Diagnosis not present

## 2016-07-28 DIAGNOSIS — M810 Age-related osteoporosis without current pathological fracture: Secondary | ICD-10-CM | POA: Diagnosis not present

## 2016-07-28 DIAGNOSIS — S32501D Unspecified fracture of right pubis, subsequent encounter for fracture with routine healing: Secondary | ICD-10-CM | POA: Diagnosis not present

## 2016-08-02 DIAGNOSIS — M810 Age-related osteoporosis without current pathological fracture: Secondary | ICD-10-CM | POA: Diagnosis not present

## 2016-08-02 DIAGNOSIS — S32501D Unspecified fracture of right pubis, subsequent encounter for fracture with routine healing: Secondary | ICD-10-CM | POA: Diagnosis not present

## 2016-08-02 DIAGNOSIS — Z9181 History of falling: Secondary | ICD-10-CM | POA: Diagnosis not present

## 2016-08-02 DIAGNOSIS — E785 Hyperlipidemia, unspecified: Secondary | ICD-10-CM | POA: Diagnosis not present

## 2016-08-02 DIAGNOSIS — I10 Essential (primary) hypertension: Secondary | ICD-10-CM | POA: Diagnosis not present

## 2016-08-05 DIAGNOSIS — Z9181 History of falling: Secondary | ICD-10-CM | POA: Diagnosis not present

## 2016-08-05 DIAGNOSIS — I10 Essential (primary) hypertension: Secondary | ICD-10-CM | POA: Diagnosis not present

## 2016-08-05 DIAGNOSIS — E785 Hyperlipidemia, unspecified: Secondary | ICD-10-CM | POA: Diagnosis not present

## 2016-08-05 DIAGNOSIS — M810 Age-related osteoporosis without current pathological fracture: Secondary | ICD-10-CM | POA: Diagnosis not present

## 2016-08-05 DIAGNOSIS — S32501D Unspecified fracture of right pubis, subsequent encounter for fracture with routine healing: Secondary | ICD-10-CM | POA: Diagnosis not present

## 2016-08-08 DIAGNOSIS — S32591D Other specified fracture of right pubis, subsequent encounter for fracture with routine healing: Secondary | ICD-10-CM | POA: Diagnosis not present

## 2016-08-09 DIAGNOSIS — I10 Essential (primary) hypertension: Secondary | ICD-10-CM | POA: Diagnosis not present

## 2016-08-09 DIAGNOSIS — S32501D Unspecified fracture of right pubis, subsequent encounter for fracture with routine healing: Secondary | ICD-10-CM | POA: Diagnosis not present

## 2016-08-09 DIAGNOSIS — E785 Hyperlipidemia, unspecified: Secondary | ICD-10-CM | POA: Diagnosis not present

## 2016-08-09 DIAGNOSIS — Z9181 History of falling: Secondary | ICD-10-CM | POA: Diagnosis not present

## 2016-08-09 DIAGNOSIS — M810 Age-related osteoporosis without current pathological fracture: Secondary | ICD-10-CM | POA: Diagnosis not present

## 2016-08-12 DIAGNOSIS — E785 Hyperlipidemia, unspecified: Secondary | ICD-10-CM | POA: Diagnosis not present

## 2016-08-12 DIAGNOSIS — I10 Essential (primary) hypertension: Secondary | ICD-10-CM | POA: Diagnosis not present

## 2016-08-12 DIAGNOSIS — Z9181 History of falling: Secondary | ICD-10-CM | POA: Diagnosis not present

## 2016-08-12 DIAGNOSIS — M810 Age-related osteoporosis without current pathological fracture: Secondary | ICD-10-CM | POA: Diagnosis not present

## 2016-08-12 DIAGNOSIS — S32501D Unspecified fracture of right pubis, subsequent encounter for fracture with routine healing: Secondary | ICD-10-CM | POA: Diagnosis not present

## 2016-08-18 DIAGNOSIS — I10 Essential (primary) hypertension: Secondary | ICD-10-CM | POA: Diagnosis not present

## 2016-08-18 DIAGNOSIS — S32501D Unspecified fracture of right pubis, subsequent encounter for fracture with routine healing: Secondary | ICD-10-CM | POA: Diagnosis not present

## 2016-08-18 DIAGNOSIS — M810 Age-related osteoporosis without current pathological fracture: Secondary | ICD-10-CM | POA: Diagnosis not present

## 2016-08-18 DIAGNOSIS — Z9181 History of falling: Secondary | ICD-10-CM | POA: Diagnosis not present

## 2016-08-18 DIAGNOSIS — E785 Hyperlipidemia, unspecified: Secondary | ICD-10-CM | POA: Diagnosis not present

## 2016-09-05 DIAGNOSIS — S32591D Other specified fracture of right pubis, subsequent encounter for fracture with routine healing: Secondary | ICD-10-CM | POA: Diagnosis not present

## 2016-09-13 DIAGNOSIS — M9902 Segmental and somatic dysfunction of thoracic region: Secondary | ICD-10-CM | POA: Diagnosis not present

## 2016-09-13 DIAGNOSIS — M5125 Other intervertebral disc displacement, thoracolumbar region: Secondary | ICD-10-CM | POA: Diagnosis not present

## 2016-09-13 DIAGNOSIS — M9903 Segmental and somatic dysfunction of lumbar region: Secondary | ICD-10-CM | POA: Diagnosis not present

## 2016-09-13 DIAGNOSIS — M9901 Segmental and somatic dysfunction of cervical region: Secondary | ICD-10-CM | POA: Diagnosis not present

## 2016-09-13 DIAGNOSIS — M5020 Other cervical disc displacement, unspecified cervical region: Secondary | ICD-10-CM | POA: Diagnosis not present

## 2016-10-17 DIAGNOSIS — S32591D Other specified fracture of right pubis, subsequent encounter for fracture with routine healing: Secondary | ICD-10-CM | POA: Diagnosis not present

## 2016-10-19 DIAGNOSIS — D649 Anemia, unspecified: Secondary | ICD-10-CM | POA: Diagnosis not present

## 2016-10-19 DIAGNOSIS — E663 Overweight: Secondary | ICD-10-CM | POA: Diagnosis not present

## 2016-10-19 DIAGNOSIS — R5383 Other fatigue: Secondary | ICD-10-CM | POA: Diagnosis not present

## 2016-10-19 DIAGNOSIS — Z6825 Body mass index (BMI) 25.0-25.9, adult: Secondary | ICD-10-CM | POA: Diagnosis not present

## 2016-10-19 DIAGNOSIS — I10 Essential (primary) hypertension: Secondary | ICD-10-CM | POA: Diagnosis not present

## 2016-10-26 DIAGNOSIS — R011 Cardiac murmur, unspecified: Secondary | ICD-10-CM | POA: Diagnosis not present

## 2016-10-26 DIAGNOSIS — I10 Essential (primary) hypertension: Secondary | ICD-10-CM | POA: Diagnosis not present

## 2016-10-26 DIAGNOSIS — R5383 Other fatigue: Secondary | ICD-10-CM | POA: Diagnosis not present

## 2016-10-26 DIAGNOSIS — E785 Hyperlipidemia, unspecified: Secondary | ICD-10-CM | POA: Diagnosis not present

## 2016-10-26 DIAGNOSIS — N183 Chronic kidney disease, stage 3 (moderate): Secondary | ICD-10-CM | POA: Diagnosis not present

## 2016-10-31 DIAGNOSIS — E785 Hyperlipidemia, unspecified: Secondary | ICD-10-CM | POA: Diagnosis not present

## 2016-10-31 DIAGNOSIS — N183 Chronic kidney disease, stage 3 (moderate): Secondary | ICD-10-CM | POA: Diagnosis not present

## 2016-10-31 DIAGNOSIS — R5383 Other fatigue: Secondary | ICD-10-CM | POA: Diagnosis not present

## 2016-10-31 DIAGNOSIS — R011 Cardiac murmur, unspecified: Secondary | ICD-10-CM | POA: Diagnosis not present

## 2016-10-31 DIAGNOSIS — I10 Essential (primary) hypertension: Secondary | ICD-10-CM | POA: Diagnosis not present

## 2016-11-22 DIAGNOSIS — M5020 Other cervical disc displacement, unspecified cervical region: Secondary | ICD-10-CM | POA: Diagnosis not present

## 2016-11-22 DIAGNOSIS — M9903 Segmental and somatic dysfunction of lumbar region: Secondary | ICD-10-CM | POA: Diagnosis not present

## 2016-11-22 DIAGNOSIS — M9901 Segmental and somatic dysfunction of cervical region: Secondary | ICD-10-CM | POA: Diagnosis not present

## 2016-11-22 DIAGNOSIS — M5125 Other intervertebral disc displacement, thoracolumbar region: Secondary | ICD-10-CM | POA: Diagnosis not present

## 2016-11-22 DIAGNOSIS — M9902 Segmental and somatic dysfunction of thoracic region: Secondary | ICD-10-CM | POA: Diagnosis not present

## 2016-12-06 DIAGNOSIS — M5125 Other intervertebral disc displacement, thoracolumbar region: Secondary | ICD-10-CM | POA: Diagnosis not present

## 2016-12-06 DIAGNOSIS — M5020 Other cervical disc displacement, unspecified cervical region: Secondary | ICD-10-CM | POA: Diagnosis not present

## 2016-12-06 DIAGNOSIS — M9901 Segmental and somatic dysfunction of cervical region: Secondary | ICD-10-CM | POA: Diagnosis not present

## 2016-12-06 DIAGNOSIS — M9902 Segmental and somatic dysfunction of thoracic region: Secondary | ICD-10-CM | POA: Diagnosis not present

## 2016-12-06 DIAGNOSIS — M9903 Segmental and somatic dysfunction of lumbar region: Secondary | ICD-10-CM | POA: Diagnosis not present

## 2016-12-07 DIAGNOSIS — Z23 Encounter for immunization: Secondary | ICD-10-CM | POA: Diagnosis not present

## 2016-12-20 DIAGNOSIS — M5125 Other intervertebral disc displacement, thoracolumbar region: Secondary | ICD-10-CM | POA: Diagnosis not present

## 2016-12-20 DIAGNOSIS — M9901 Segmental and somatic dysfunction of cervical region: Secondary | ICD-10-CM | POA: Diagnosis not present

## 2016-12-20 DIAGNOSIS — M5020 Other cervical disc displacement, unspecified cervical region: Secondary | ICD-10-CM | POA: Diagnosis not present

## 2016-12-20 DIAGNOSIS — M9903 Segmental and somatic dysfunction of lumbar region: Secondary | ICD-10-CM | POA: Diagnosis not present

## 2016-12-20 DIAGNOSIS — M9902 Segmental and somatic dysfunction of thoracic region: Secondary | ICD-10-CM | POA: Diagnosis not present

## 2017-01-03 DIAGNOSIS — M9903 Segmental and somatic dysfunction of lumbar region: Secondary | ICD-10-CM | POA: Diagnosis not present

## 2017-01-03 DIAGNOSIS — M5020 Other cervical disc displacement, unspecified cervical region: Secondary | ICD-10-CM | POA: Diagnosis not present

## 2017-01-03 DIAGNOSIS — M5125 Other intervertebral disc displacement, thoracolumbar region: Secondary | ICD-10-CM | POA: Diagnosis not present

## 2017-01-03 DIAGNOSIS — M9901 Segmental and somatic dysfunction of cervical region: Secondary | ICD-10-CM | POA: Diagnosis not present

## 2017-01-03 DIAGNOSIS — M9902 Segmental and somatic dysfunction of thoracic region: Secondary | ICD-10-CM | POA: Diagnosis not present

## 2017-01-10 DIAGNOSIS — L03115 Cellulitis of right lower limb: Secondary | ICD-10-CM | POA: Diagnosis not present

## 2017-01-10 DIAGNOSIS — Z23 Encounter for immunization: Secondary | ICD-10-CM | POA: Diagnosis not present

## 2017-01-10 DIAGNOSIS — L02415 Cutaneous abscess of right lower limb: Secondary | ICD-10-CM | POA: Diagnosis not present

## 2017-01-17 DIAGNOSIS — M9902 Segmental and somatic dysfunction of thoracic region: Secondary | ICD-10-CM | POA: Diagnosis not present

## 2017-01-17 DIAGNOSIS — M9901 Segmental and somatic dysfunction of cervical region: Secondary | ICD-10-CM | POA: Diagnosis not present

## 2017-01-17 DIAGNOSIS — M5020 Other cervical disc displacement, unspecified cervical region: Secondary | ICD-10-CM | POA: Diagnosis not present

## 2017-01-17 DIAGNOSIS — M9903 Segmental and somatic dysfunction of lumbar region: Secondary | ICD-10-CM | POA: Diagnosis not present

## 2017-01-17 DIAGNOSIS — M5125 Other intervertebral disc displacement, thoracolumbar region: Secondary | ICD-10-CM | POA: Diagnosis not present

## 2017-01-31 DIAGNOSIS — S81811D Laceration without foreign body, right lower leg, subsequent encounter: Secondary | ICD-10-CM | POA: Diagnosis not present

## 2017-01-31 DIAGNOSIS — M9902 Segmental and somatic dysfunction of thoracic region: Secondary | ICD-10-CM | POA: Diagnosis not present

## 2017-01-31 DIAGNOSIS — M9901 Segmental and somatic dysfunction of cervical region: Secondary | ICD-10-CM | POA: Diagnosis not present

## 2017-01-31 DIAGNOSIS — M5020 Other cervical disc displacement, unspecified cervical region: Secondary | ICD-10-CM | POA: Diagnosis not present

## 2017-01-31 DIAGNOSIS — M9903 Segmental and somatic dysfunction of lumbar region: Secondary | ICD-10-CM | POA: Diagnosis not present

## 2017-01-31 DIAGNOSIS — M5125 Other intervertebral disc displacement, thoracolumbar region: Secondary | ICD-10-CM | POA: Diagnosis not present

## 2017-03-28 DIAGNOSIS — H2513 Age-related nuclear cataract, bilateral: Secondary | ICD-10-CM | POA: Diagnosis not present

## 2017-05-23 DIAGNOSIS — S81811D Laceration without foreign body, right lower leg, subsequent encounter: Secondary | ICD-10-CM | POA: Diagnosis not present

## 2017-05-31 DIAGNOSIS — M793 Panniculitis, unspecified: Secondary | ICD-10-CM | POA: Diagnosis not present

## 2017-05-31 DIAGNOSIS — D489 Neoplasm of uncertain behavior, unspecified: Secondary | ICD-10-CM | POA: Diagnosis not present

## 2017-06-08 DIAGNOSIS — L720 Epidermal cyst: Secondary | ICD-10-CM | POA: Diagnosis not present

## 2017-06-16 DIAGNOSIS — L720 Epidermal cyst: Secondary | ICD-10-CM | POA: Diagnosis not present

## 2017-07-19 DIAGNOSIS — E7800 Pure hypercholesterolemia, unspecified: Secondary | ICD-10-CM | POA: Diagnosis not present

## 2017-07-19 DIAGNOSIS — N189 Chronic kidney disease, unspecified: Secondary | ICD-10-CM | POA: Diagnosis not present

## 2017-07-19 DIAGNOSIS — I10 Essential (primary) hypertension: Secondary | ICD-10-CM | POA: Diagnosis not present

## 2017-07-19 DIAGNOSIS — Z85820 Personal history of malignant melanoma of skin: Secondary | ICD-10-CM | POA: Diagnosis not present

## 2017-07-19 DIAGNOSIS — Z0000 Encounter for general adult medical examination without abnormal findings: Secondary | ICD-10-CM | POA: Diagnosis not present

## 2017-07-19 DIAGNOSIS — H6121 Impacted cerumen, right ear: Secondary | ICD-10-CM | POA: Diagnosis not present

## 2017-07-19 DIAGNOSIS — M810 Age-related osteoporosis without current pathological fracture: Secondary | ICD-10-CM | POA: Diagnosis not present

## 2017-07-25 DIAGNOSIS — I10 Essential (primary) hypertension: Secondary | ICD-10-CM | POA: Diagnosis not present

## 2017-12-06 DIAGNOSIS — Z23 Encounter for immunization: Secondary | ICD-10-CM | POA: Diagnosis not present

## 2018-01-11 ENCOUNTER — Other Ambulatory Visit: Payer: Self-pay

## 2018-01-11 ENCOUNTER — Emergency Department (HOSPITAL_COMMUNITY): Payer: Medicare Other

## 2018-01-11 ENCOUNTER — Encounter (HOSPITAL_COMMUNITY): Payer: Self-pay | Admitting: Emergency Medicine

## 2018-01-11 ENCOUNTER — Emergency Department (HOSPITAL_COMMUNITY)
Admission: EM | Admit: 2018-01-11 | Discharge: 2018-01-11 | Disposition: A | Discharge status: Home / Self Care | Payer: Medicare Other | Attending: Emergency Medicine | Admitting: Emergency Medicine

## 2018-01-11 DIAGNOSIS — Z79899 Other long term (current) drug therapy: Secondary | ICD-10-CM | POA: Diagnosis not present

## 2018-01-11 DIAGNOSIS — I10 Essential (primary) hypertension: Secondary | ICD-10-CM | POA: Diagnosis not present

## 2018-01-11 DIAGNOSIS — R002 Palpitations: Secondary | ICD-10-CM | POA: Diagnosis not present

## 2018-01-11 DIAGNOSIS — R0602 Shortness of breath: Secondary | ICD-10-CM | POA: Diagnosis not present

## 2018-01-11 DIAGNOSIS — R079 Chest pain, unspecified: Secondary | ICD-10-CM | POA: Diagnosis not present

## 2018-01-11 LAB — BASIC METABOLIC PANEL
Anion gap: 10 (ref 5–15)
BUN: 29 mg/dL — AB (ref 8–23)
CO2: 21 mmol/L — ABNORMAL LOW (ref 22–32)
Calcium: 10 mg/dL (ref 8.9–10.3)
Chloride: 107 mmol/L (ref 98–111)
Creatinine, Ser: 1.77 mg/dL — ABNORMAL HIGH (ref 0.44–1.00)
GFR calc Af Amer: 27 mL/min — ABNORMAL LOW (ref >60)
GFR, EST NON AFRICAN AMERICAN: 23 mL/min — AB (ref >60)
Glucose, Bld: 97 mg/dL (ref 70–99)
Potassium: 4.5 mmol/L (ref 3.5–5.1)
Sodium: 138 mmol/L (ref 135–145)

## 2018-01-11 LAB — I-STAT CG4 LACTIC ACID, ED: Lactic Acid, Venous: 0.77 mmol/L (ref 0.5–1.9)

## 2018-01-11 LAB — CBC
HEMATOCRIT: 34.1 % — AB (ref 36.0–46.0)
HEMOGLOBIN: 10.2 g/dL — AB (ref 12.0–15.0)
MCH: 26.1 pg (ref 26.0–34.0)
MCHC: 29.9 g/dL — AB (ref 30.0–36.0)
MCV: 87.2 fL (ref 80.0–100.0)
Platelets: 198 10*3/uL (ref 150–400)
RBC: 3.91 MIL/uL (ref 3.87–5.11)
RDW: 15 % (ref 11.5–15.5)
WBC: 4 10*3/uL (ref 4.0–10.5)
nRBC: 0 % (ref 0.0–0.2)

## 2018-01-11 LAB — I-STAT TROPONIN, ED
TROPONIN I, POC: 0.01 ng/mL (ref 0.00–0.08)
Troponin i, poc: 0.02 ng/mL (ref 0.00–0.08)

## 2018-01-11 NOTE — ED Provider Notes (Signed)
[facility] DEPART[facility] Provider Note   CSN: [id number] Arrival date & time: 01/11/18  4098     History   Chief Complaint No chief complaint on file.   HPI Krista Paul is a 84 y.o. female sent in by her primary care physician for complaint of chest pain.  The patient states that this morning while she was sitting she noticed she had some pain in her left chest and left arm.  She rates the pain as a 5 out of 6.  She says it was achy.  She denies numbness or tingling.  She denies nausea, vomiting, diaphoresis or shortness of breath.  She went to see her primary care physician who referred her to the ER for further work-up.  She is currently chest pain-free and states that her pain lasted no more than 15 minutes.  It was not associated with exertion.  She has a past medical history of hypertension and hyperlipidemia.  ray     History reviewed. No pertinent past medical history.  There are no active problems to display for this patient.   Past Surgical History:  Procedure Laterality Date  . ABDOMINAL HYSTERECTOMY    . TOTAL KNEE ARTHROPLASTY Left      OB History   None      Home Medications    Prior to Admission medications   Medication Sig Start Date End Date Taking? Authorizing Provider  amLODipine (NORVASC) 5 MG tablet Take 5 mg by mouth daily. 08/12/14  Yes [provider]  cholecalciferol (VITAMIN D3) 25 MCG (1000 UT) tablet Take 1,000 Units by mouth daily.   Yes [provider]  pravastatin (PRAVACHOL) 10 MG tablet Take 10 mg by mouth every evening.  08/20/14  Yes [provider]    Family History No family history on file.  Social History Social History   Tobacco Use  . Smoking status: Never Smoker  Substance Use Topics  . Alcohol use: No  . Drug use: No     Allergies   Caduet [amlodipine-atorvastatin]; Codeine; Fosamax [alendronate sodium]; Nsaids; and Simvastatin   Review of Systems Review  of Systems Ten systems reviewed and are negative for acute change, except as noted in the HPI.    Physical Exam Updated Vital Signs BP 114/83   Pulse 84   Temp 98.1 F (36.7 C) (Oral)   Resp 18   SpO2 99%   Physical Exam  Constitutional: She is oriented to person, place, and time. She appears well-developed and well-nourished. No distress.  HENT:  Head: Normocephalic and atraumatic.  Eyes: Conjunctivae are normal. No scleral icterus.  Neck: Normal range of motion.  Cardiovascular: Normal rate, regular rhythm and normal heart sounds. Exam reveals no gallop and no friction rub.  No murmur heard. Pulmonary/Chest: Effort normal and breath sounds normal. No respiratory distress.  Abdominal: Soft. Bowel sounds are normal. She exhibits no distension and no mass. There is no tenderness. There is no guarding.  Neurological: She is alert and oriented to person, place, and time.  Skin: Skin is warm and dry. She is not diaphoretic.  Psychiatric: Her behavior is normal.  Nursing note and vitals reviewed.    ED Treatments / Results  Labs (all labs ordered are listed, but only abnormal results are displayed) Labs Reviewed  BASIC METABOLIC PANEL - Abnormal; Notable for the following components:      Result Value   CO2 21 (*)    BUN 29 (*)    Creatinine,  Ser 1.77 (*)    GFR calc non Af Amer 23 (*)    GFR calc Af Amer 27 (*)    All other components within normal limits  CBC - Abnormal; Notable for the following components:   Hemoglobin 10.2 (*)    HCT 34.1 (*)    MCHC 29.9 (*)    All other components within normal limits  I-STAT TROPONIN, ED  I-STAT CG4 LACTIC ACID, ED  I-STAT TROPONIN, ED  I-STAT TROPONIN, ED    EKG EKG Interpretation  Date/Time:  Thursday January 11 2018 09:51:22 EST Ventricular Rate:  78 PR Interval:    QRS Duration: 126 QT Interval:  395 QTC Calculation: 450 R Axis:   -90 Text Interpretation:  Normal sinus rhythm rbbb and lafb No significant change  since last tracing Confirmed by Pattricia Boss [id number]) on 01/11/2018 11:39:53 AM   Radiology Dg Chest 2 View  Result Date: 01/11/2018 CLINICAL DATA:  Chest pain today. EXAM: CHEST - 2 VIEW COMPARISON:  10/19/2016 FINDINGS: Heart is mildly enlarged. The aorta is calcified and tortuous. There are no focal consolidations. No pulmonary edema. Trace bilateral pleural effusions. IMPRESSION: Stable cardiomegaly.  Aortic atherosclerosis.  (ICD10-I70.0) Electronically Signed   By: Nolon Nations M.D.   On: 01/11/2018 10:53    Procedures Procedures (including critical care time)  Medications Ordered in ED Medications - No data to display   Initial Impression / Assessment and Plan / ED Course  I have reviewed the triage vital signs and the nursing notes.  Pertinent labs & imaging results that were available during my care of the patient were reviewed by me and considered in my medical decision making (see chart for details).     This is a 84 year old female who presents with about 15 minutes of atypical chest pain.  She was sent in by her primary care physician for evaluation.  The patient has a moderate risk heart score for major adverse cardiac events.  She has an abnormal EKG however it is unchanged from her previous.  She does not have any evidence of ischemic EKG.  The patient was seen in a shared visit with Dr. Jeanell Sparrow.  We discussed risk and benefits of admission and the patient is very clear that she does not wish to be admitted and does understand those risks.  The patient also has 2- troponins without any return of chest pain.  She has close follow-up with her primary care physician.  The patient appears appropriate for discharge.  Her chest x-ray is reviewed by me and shows no acute abnormalities.  Agree with the radiologic interpretation.  I have discussed return precautions with the patient. Final Clinical Impressions(s) / ED Diagnoses   Final diagnoses:  Chest pain, unspecified type     ED Discharge Orders    None       Margarita Mail, PA-C 01/12/18 1027    Pattricia Boss, MD 01/13/18 1534

## 2018-01-11 NOTE — ED Notes (Signed)
Patient denies pain and is resting comfortably.  

## 2018-01-11 NOTE — ED Notes (Signed)
Patient transported to X-ray 

## 2018-01-11 NOTE — Discharge Instructions (Addendum)

## 2018-01-11 NOTE — ED Notes (Signed)
ED Provider at bedside. 

## 2018-01-11 NOTE — ED Notes (Signed)
D/c reviewed with patient and family 

## 2018-01-11 NOTE — ED Triage Notes (Signed)
Patient reports she had chest pain this morning, went to her PCP and was sent to the ED.

## 2018-01-29 DIAGNOSIS — L603 Nail dystrophy: Secondary | ICD-10-CM | POA: Diagnosis not present

## 2018-01-29 DIAGNOSIS — I739 Peripheral vascular disease, unspecified: Secondary | ICD-10-CM | POA: Diagnosis not present

## 2018-03-07 DIAGNOSIS — 419620001 Death: Secondary | SNOMED CT | POA: Diagnosis not present

## 2018-03-07 DEATH — deceased

## 2018-08-19 IMAGING — CT CT PELVIS W/O CM
2 of 3 series · 16 of 46 positions shown, 18 images · non-contrast
Comparison: None.

CLINICAL DATA: Status post fall with right hip pain.

EXAM:
CT PELVIS WITHOUT CONTRAST
TECHNIQUE: Multidetector CT imaging of the pelvis was performed following the
standard protocol without intravenous contrast.

[Series 3: soft tissue pelvis/hip · axial · 0.78mm/px · z∈[-407,-158]mm · 13 of 97 slices shown, 15 images]
[im 7/97  soft-tissue]
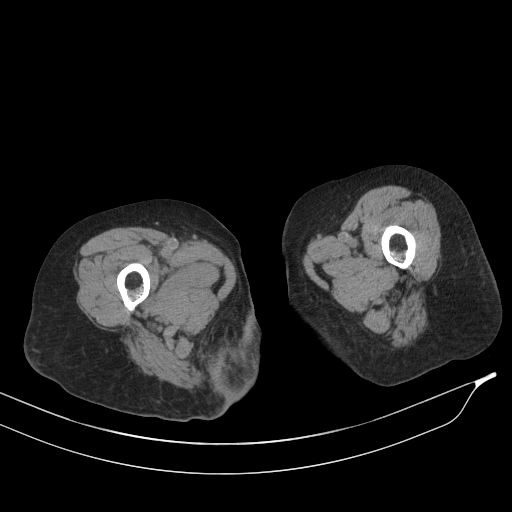
[im 7/97  bone]
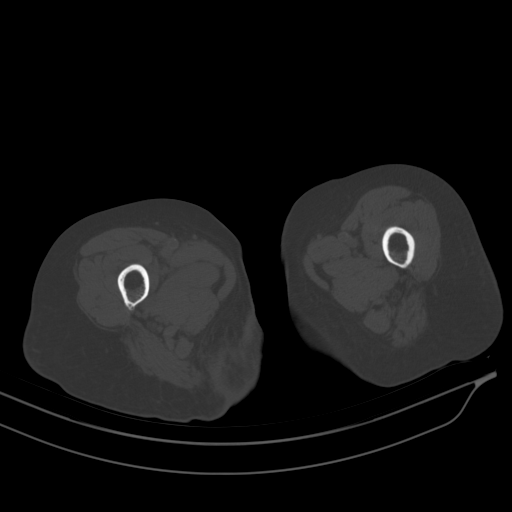
[im 13/97  soft-tissue]
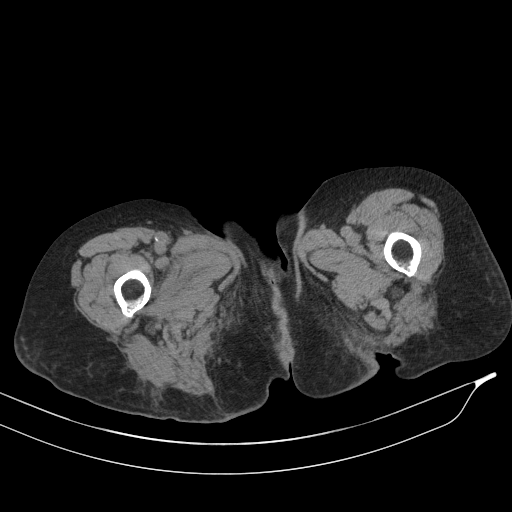
[im 19/97  soft-tissue]
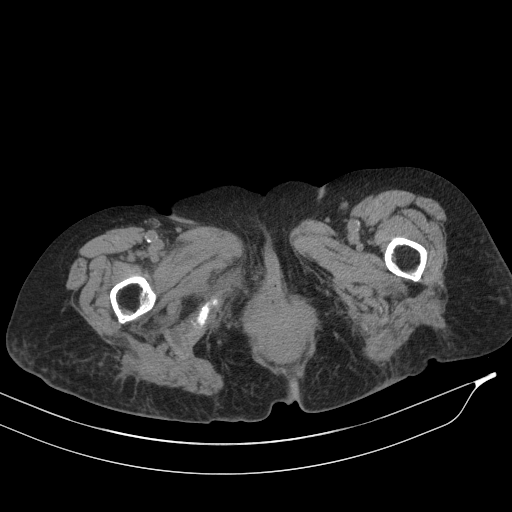
[im 28/97  soft-tissue]
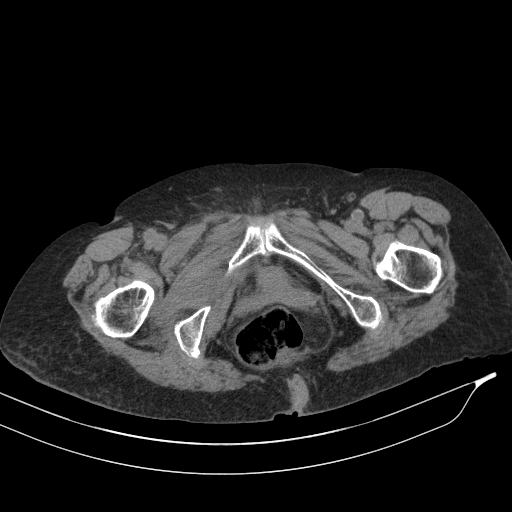
[im 35/97  soft-tissue]
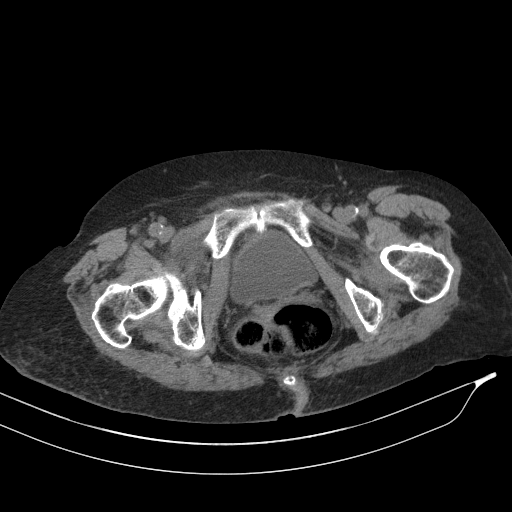
[im 41/97  soft-tissue]
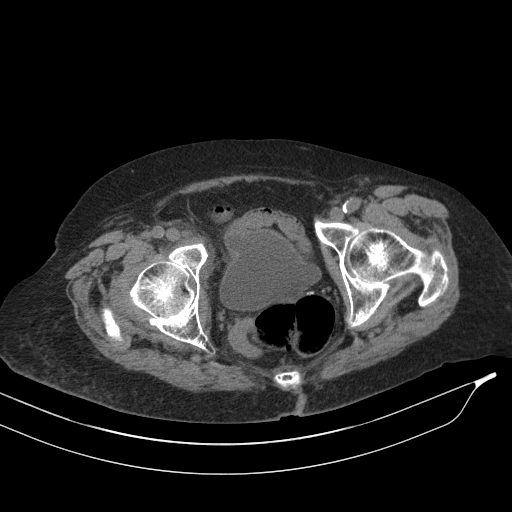
[im 50/97  soft-tissue]
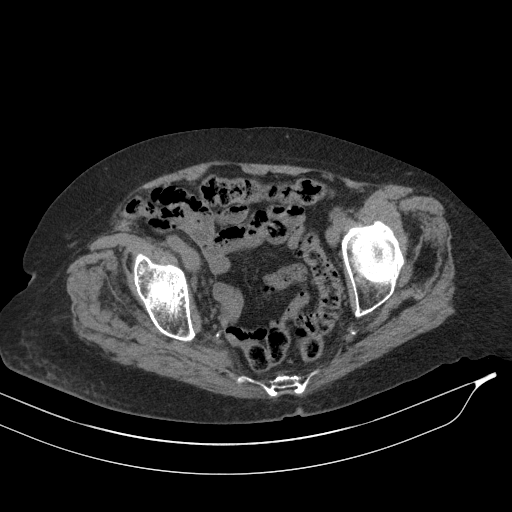
[im 56/97  soft-tissue]
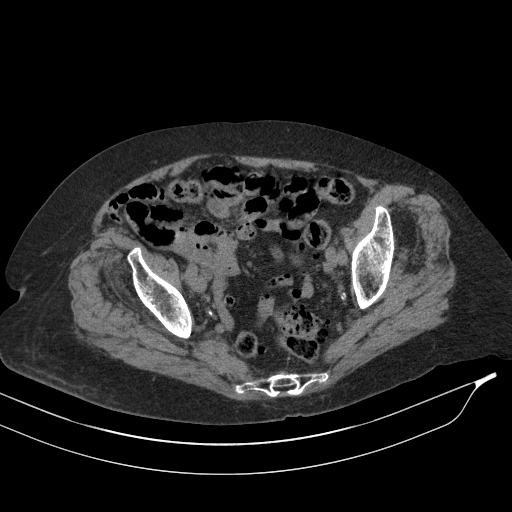
[im 62/97  soft-tissue]
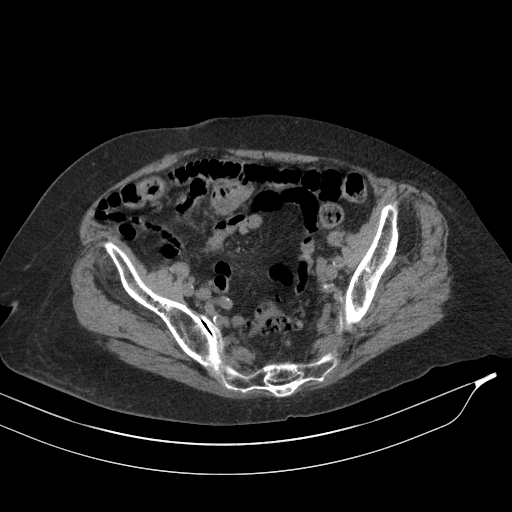
[im 62/97  bone]
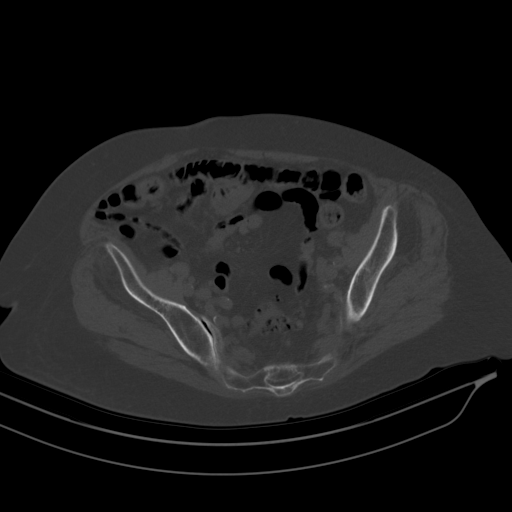
[im 69/97  soft-tissue]
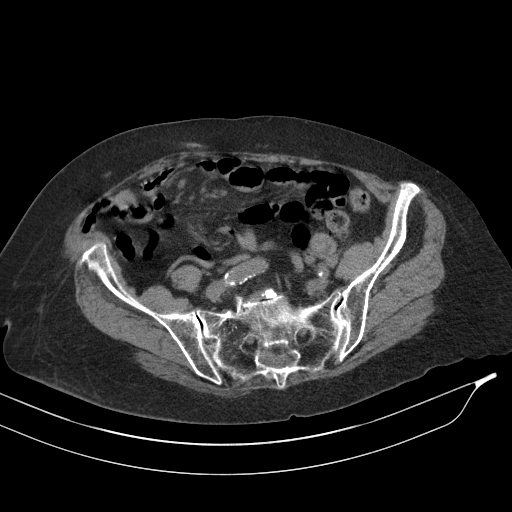
[im 78/97  soft-tissue]
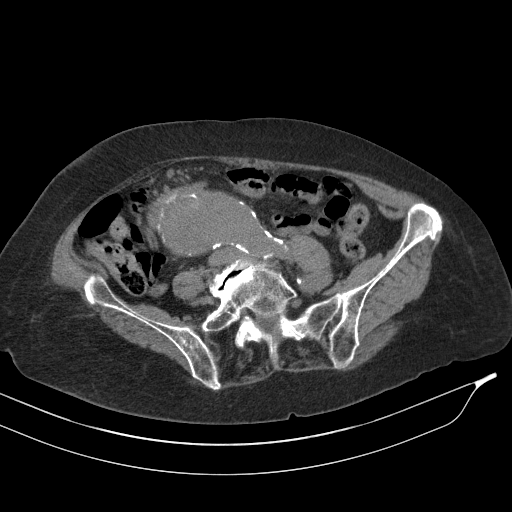
[im 84/97  soft-tissue]
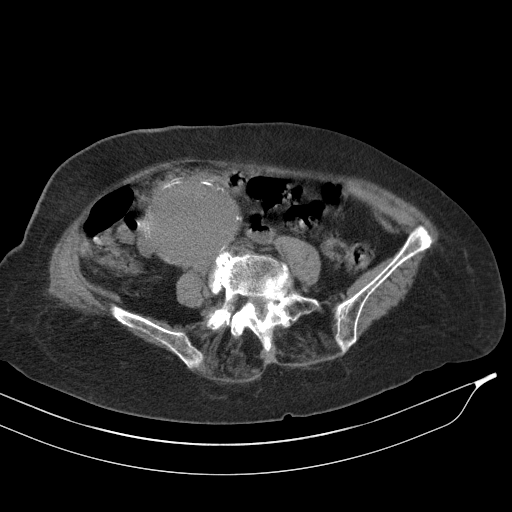
[im 90/97  soft-tissue]
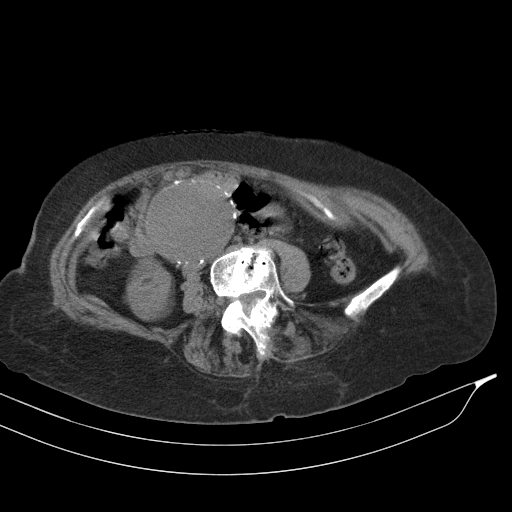

[Series 6: cor · coronal · 0.56mm/px · 3 of 77 slices shown]
[im 26/77  soft-tissue]
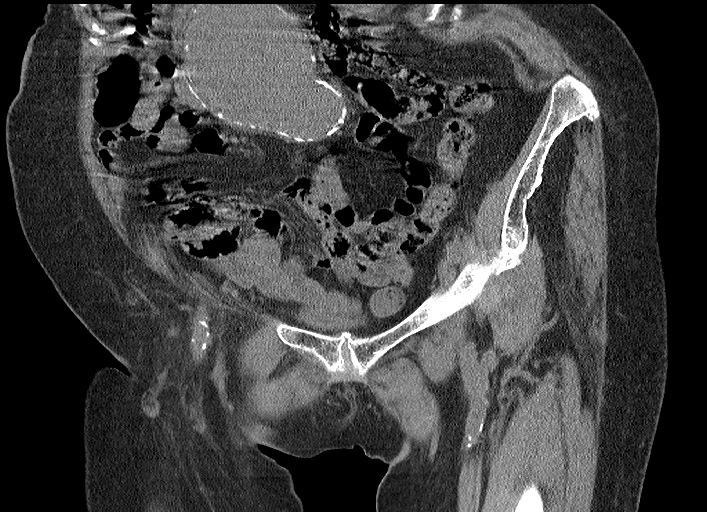
[im 34/77  soft-tissue]
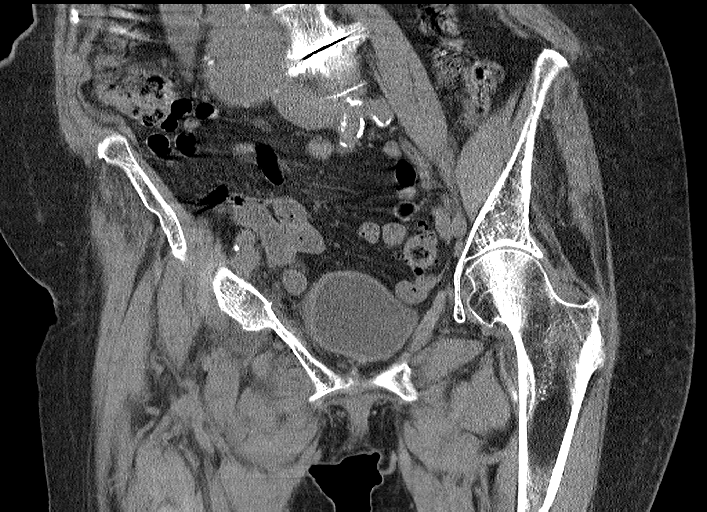
[im 43/77  soft-tissue]
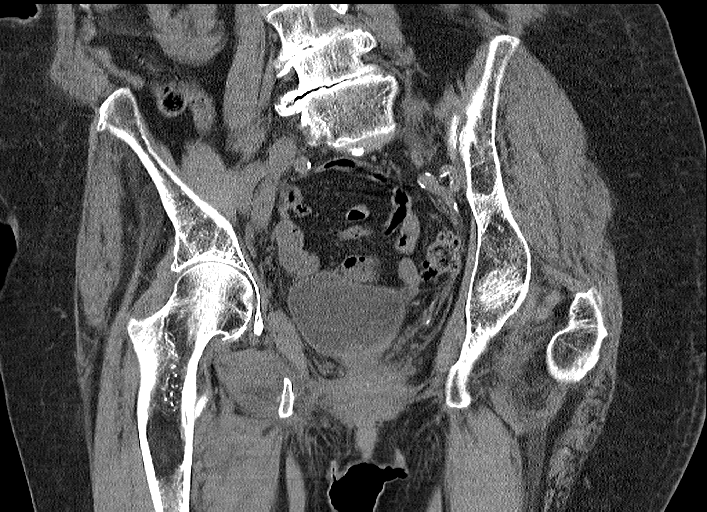

[16 of 46 positions shown; findings below may reference images not displayed]

FINDINGS: Urinary Tract:  No abnormality visualized.

Bowel:  Unremarkable visualized pelvic bowel loops.

Vascular/Lymphatic: No pathologically enlarged lymph nodes. There is
a large distal abdominal aortic fusiform aneurysm measuring 7.4 x
6.7 (transverse and AP diameter). The superior-most aspect of the
aneurysm is excluded by collimation, but the craniocaudal dimension
of the aneurysmal sac is at least 7 cm.

Reproductive:  Post hysterectomy.

Other:  None.

Musculoskeletal: Advanced osteopenia. There is a mildly comminuted
acute transverse fracture of the mid right superior pubic ramus,
without significant displacement. There is a car is a shin of L5
with curvilinear lucency through the right lateral process of L5
which may represent a nondisplaced fracture of unknown chronicity.
There is no evidence of hip fracture.
IMPRESSION: Advanced osteopenia.

Mildly comminuted acute transverse fracture of the right mid
superior pubic ramus.

Possible age-indeterminate nondisplaced fracture of the sacralized
right transverse process of L5 vertebral body.

Large fusiform distal abdominal aortic aneurysm measuring 7.4 cm in
transverse diameter. Consultation with a vascular specialist may be
considered if found clinically necessary.
